# Patient Record
Sex: Female | Born: 1965
Health system: Southern US, Community
[De-identification: ages and names within clinical notes are randomized; demographics above are authoritative.]

## PROBLEM LIST (undated history)

## (undated) DIAGNOSIS — A630 Anogenital (venereal) warts: Secondary | ICD-10-CM

## (undated) DIAGNOSIS — R42 Dizziness and giddiness: Secondary | ICD-10-CM

## (undated) DIAGNOSIS — B019 Varicella without complication: Secondary | ICD-10-CM

## (undated) DIAGNOSIS — K635 Polyp of colon: Secondary | ICD-10-CM

## (undated) DIAGNOSIS — K219 Gastro-esophageal reflux disease without esophagitis: Secondary | ICD-10-CM

## (undated) DIAGNOSIS — N3281 Overactive bladder: Secondary | ICD-10-CM

## (undated) DIAGNOSIS — G039 Meningitis, unspecified: Secondary | ICD-10-CM

## (undated) HISTORY — DX: Varicella without complication: B01.9

## (undated) HISTORY — DX: Polyp of colon: K63.5

## (undated) HISTORY — DX: Anogenital (venereal) warts: A63.0

## (undated) HISTORY — DX: Gastro-esophageal reflux disease without esophagitis: K21.9

## (undated) HISTORY — DX: Dizziness and giddiness: R42

## (undated) HISTORY — PX: OTHER SURGICAL HISTORY: SHX169

## (undated) HISTORY — DX: Meningitis, unspecified: G03.9

## (undated) HISTORY — PX: BREAST SURGERY: SHX581

## (undated) HISTORY — DX: Overactive bladder: N32.81

---

## 2002-03-22 ENCOUNTER — Other Ambulatory Visit: Admission: RE | Admit: 2002-03-22 | Discharge: 2002-03-22 | Payer: Self-pay | Admitting: Obstetrics and Gynecology

## 2003-11-07 ENCOUNTER — Other Ambulatory Visit: Admission: RE | Admit: 2003-11-07 | Discharge: 2003-11-07 | Payer: Self-pay | Admitting: Obstetrics and Gynecology

## 2005-05-30 ENCOUNTER — Encounter: Admission: RE | Admit: 2005-05-30 | Discharge: 2005-05-30 | Payer: Self-pay | Admitting: Obstetrics and Gynecology

## 2010-12-29 DIAGNOSIS — G039 Meningitis, unspecified: Secondary | ICD-10-CM

## 2010-12-29 HISTORY — DX: Meningitis, unspecified: G03.9

## 2011-06-24 ENCOUNTER — Other Ambulatory Visit: Payer: Self-pay | Admitting: Family Medicine

## 2011-06-24 ENCOUNTER — Encounter: Payer: Self-pay | Admitting: Family Medicine

## 2011-06-24 ENCOUNTER — Inpatient Hospital Stay (INDEPENDENT_AMBULATORY_CARE_PROVIDER_SITE_OTHER)
Admission: RE | Admit: 2011-06-24 | Discharge: 2011-06-24 | Disposition: A | Discharge status: Home / Self Care | Payer: 59 | Source: Ambulatory Visit | Attending: Family Medicine | Admitting: Family Medicine

## 2011-06-24 ENCOUNTER — Ambulatory Visit
Admission: RE | Admit: 2011-06-24 | Discharge: 2011-06-24 | Disposition: A | Discharge status: Home / Self Care | Payer: 59 | Source: Ambulatory Visit | Attending: Family Medicine | Admitting: Family Medicine

## 2011-06-24 DIAGNOSIS — S90129A Contusion of unspecified lesser toe(s) without damage to nail, initial encounter: Secondary | ICD-10-CM

## 2011-06-24 DIAGNOSIS — S92919A Unspecified fracture of unspecified toe(s), initial encounter for closed fracture: Secondary | ICD-10-CM

## 2011-06-25 DIAGNOSIS — S92919A Unspecified fracture of unspecified toe(s), initial encounter for closed fracture: Secondary | ICD-10-CM

## 2011-06-27 ENCOUNTER — Encounter: Payer: Self-pay | Admitting: Emergency Medicine

## 2011-06-27 ENCOUNTER — Inpatient Hospital Stay (INDEPENDENT_AMBULATORY_CARE_PROVIDER_SITE_OTHER)
Admission: RE | Admit: 2011-06-27 | Discharge: 2011-06-27 | Disposition: A | Discharge status: Home / Self Care | Payer: 59 | Source: Ambulatory Visit | Attending: Emergency Medicine | Admitting: Emergency Medicine

## 2011-06-27 DIAGNOSIS — R5381 Other malaise: Secondary | ICD-10-CM

## 2011-06-27 DIAGNOSIS — R5383 Other fatigue: Secondary | ICD-10-CM | POA: Insufficient documentation

## 2011-06-27 DIAGNOSIS — B9789 Other viral agents as the cause of diseases classified elsewhere: Secondary | ICD-10-CM

## 2011-06-27 LAB — CONVERTED CEMR LAB
Bilirubin Urine: 1
Glucose, Urine, Semiquant: NEGATIVE
Ketones, urine, test strip: 2
Rapid Strep: NEGATIVE
Specific Gravity, Urine: 1.025
Urobilinogen, UA: 0.2

## 2011-06-29 ENCOUNTER — Telehealth (INDEPENDENT_AMBULATORY_CARE_PROVIDER_SITE_OTHER): Payer: Self-pay

## 2011-06-30 ENCOUNTER — Telehealth (INDEPENDENT_AMBULATORY_CARE_PROVIDER_SITE_OTHER): Payer: Self-pay | Admitting: Emergency Medicine

## 2011-12-01 NOTE — Progress Notes (Signed)
Summary: BODY ACHES/NAUESA/DIZZINESS (room 5)   Vital Signs:  Patient Profile:   45 Years Old Female CC:      body aches, dizzy, nausea, severe headache Height:     69 inches Weight:      221 pounds O2 Sat:      99 % O2 treatment:    Room Air Temp:     99.3 degrees F oral Pulse rate:   116 / minute Resp:     16 per minute BP sitting:   113 / 68  (left arm) Cuff size:   large  Vitals Entered By: Lavell Islam RN (June 27, 2011 6:52 PM)                  Current Allergies: No known allergies History of Present Illness History from: patient & husband Chief Complaint: body aches, dizzy, nausea, severe headache History of Present Illness: 45 Years Old Female complains of "being sick all over".  Feels dizzy, nauseated, sick to her stomach, body aches in her armpits and arms, feverish, HA.  She is normally very healthy and never goes to the doctor.  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  No rashes, tick bites, no new meds other than calcium and vitamin D.  She is feeling a little better now than earlier today in terms of all her symptoms but still feels ill.  Advil helps.  Under a lot of stress from this past weekend when she got into an argument with another mom.  Review of systems is grossly positive for everything: fever, nausea, fatigue, myalgias, headache, urinary frequency, mental stress.  Her husband is here and thinks her symptoms are simply from walking a lot in the heat in [place] these last few days.  REVIEW OF SYSTEMS Constitutional Symptoms      Denies fever, chills, night sweats, weight loss, weight gain, and fatigue.  Eyes       Denies change in vision, eye pain, eye discharge, glasses, contact lenses, and eye surgery. Ear/Nose/Throat/Mouth       Denies hearing loss/aids, change in hearing, ear pain, ear discharge, dizziness, frequent  runny nose, frequent nose bleeds, sinus problems, sore throat, hoarseness, and tooth pain or bleeding.  Respiratory       Denies dry cough, productive cough, wheezing, shortness of breath, asthma, bronchitis, and emphysema/COPD.  Cardiovascular       Denies murmurs, chest pain, and tires easily with exhertion.    Gastrointestinal       Complains of nausea/vomiting.      Denies stomach pain, diarrhea, constipation, blood in bowel movements, and indigestion. Genitourniary       Denies painful urination, kidney stones, and loss of urinary control.      Comments: pain in breasts Neurological       Complains of headaches and weakness.      Denies loss of or changes in sensation, numbness, tngling, tremors, paralysis, seizures, and fainting/blackouts.      Comments: dizzy Musculoskeletal       Denies muscle pain, joint pain, joint stiffness, decreased range of motion, redness, swelling, muscle weakness, and gout.  Skin       Denies bruising, unusual mles/lumps or sores, and hair/skin or nail changes.  Psych       Denies mood changes, temper/anger issues, anxiety/stress, speech problems,  depression, and sleep problems.  Past History:  Family History: Last updated: 06/27/2011  Family History of CAD Female 1st degree relative <60  Social History: Last updated: 06/24/2011 Never Smoked Alcohol use-no Drug use-no  Past Medical History: Reviewed history from 06/24/2011 and no changes required. Unremarkable  Past Surgical History: Reviewed history from 06/24/2011 and no changes required. Caesarean section breast augmentation  Family History:  Family History of CAD Female 1st degree relative <60  Social History: Reviewed history from 06/24/2011 and no changes required. Never Smoked Alcohol use-no Drug use-no Physical Exam General appearance: well developed, obese, mod distress and laying down and not looking at me and quite abrasive at first but talking more she sits up and seems  to be in no distress Pupils: PERRLA EOMI Ears: normal, no lesions or deformities Nasal: mucosa pink, nonedematous, no septal deviation, turbinates normal Oral/Pharynx: tongue normal, posterior pharynx without erythema or exudate Neck: neck supple,  trachea midline, no masses Chest/Lungs: no rales, wheezes, or rhonchi bilateral, breath sounds equal without effort Heart: regular rate and  rhythm, no murmur Abdomen: soft, non-tender without obvious organomegaly Extremities: FROM, normal gait Neurological: Cn 2-12 grossly intact, no obvious deficits Skin: no obvious rashes or lesions MSE: oriented to time, place, and person Assessment New Problems: VIRAL INFECTION (ICD-079.99) MALAISE AND FATIGUE (ICD-780.79) FAMILY HISTORY OF CAD FEMALE 1ST DEGREE RELATIVE <60 (ICD-V16.49)  She drank a whole bottle of water while here and seems to be feeling better throughout the visit  Plan New Medications/Changes: KEFLEX 500 MG CAPS (CEPHALEXIN) 1 by mouth three times a day for 1 week  #21 x 0, 06/27/2011, Hoyt Koch MD  New Orders: T-CBC w/Diff [[id number]] T-Comprehensive Metabolic Panel [[id number]] T-Ferritin [[id number]] T-TSH [[id number]] T-Iron [[id number]] UA Dipstick w/o Micro (automated)  [81003] Rapid Strep [47425] T-Culture, Urine [[id number]] Est. Patient Level IV [32951] Planning Comments:   Her range of symptoms are very confusing and vague with a completely normal physical exam.  Will test blood (CBC, CMP, TSH, ferratin, Iron), strep, and UA/UCx to see if we catch anything.  CBC will be likely most telling with the differential.  Rapid strep is negative.  UA is c/w mild dyhydration.  This is most likely viral syndrome with misc stress and mild dehydration and fatigue from overexerting herself, but in reality I'm not sure and have told the patient that.  Initially the nurse had a hard time getting any introductory info from them in terms of signs and symptoms.  She has  some vertigo symptoms as well but her nausea seems to be improving but she doesn't think it's vertigo and doesn't want to be treated with Phenergan or Meclizine.  Will start on Keflex per their ok in the meantime to cover any bacterial URI cause and encourage rest, hydration and not driving until not dizzy.  No red flags seen, but ER precautions given in case worsening.   Labs unable to be obtained so will return tomorrow.  She should also establish with a PCP since at 45 years old is the right thing to do.  The pt's husband asked me to prescribe her Vicodin for her pain and sleep meds, but the pt quickly told him she didn't want those things.  Nurse unable to obtain blood tonight, so will return tomorrow.  We will ask tomorrow how she's doing and if any worsening, may need an ER workup.   The patient and/or caregiver has been counseled thoroughly with regard to medications prescribed including dosage, schedule, interactions, rationale  for use, and possible side effects and they verbalize understanding.  Diagnoses and expected course of recovery discussed and will return if not improved as expected or if the condition worsens. Patient and/or caregiver verbalized understanding.  Prescriptions: KEFLEX 500 MG CAPS (CEPHALEXIN) 1 by mouth three times a day for 1 week  #21 x 0   Entered and Authorized by:   Hoyt Koch MD   Signed by:   Hoyt Koch MD on 06/27/2011   Method used:   Print then Give to Patient   RxID:   [id number]   Orders Added: 1)  T-CBC w/Diff [[id number]] 2)  T-Comprehensive Metabolic Panel [[id number]] 3)  T-Ferritin [[id number]] 4)  T-TSH [24401-02725] 5)  T-Iron [36644-03474] 6)  UA Dipstick w/o Micro (automated)  [81003] 7)  Rapid Strep [25956] 8)  T-Culture, Urine [[id number]] 9)  Est. Patient Level IV [51884]    Laboratory Results   Urine Tests  Date/Time Received: June 27, 2011 7:46 PM  Date/Time Reported: June 27, 2011 7:46 PM   Routine  Urinalysis   Color: straw Appearance: Hazy Glucose: negative   (Normal Range: Negative) Bilirubin: +1   (Normal Range: Negative) Ketone: +2   (Normal Range: Negative) Spec. Gravity: 1.025   (Normal Range: 1.003-1.035) Blood: trace-intact   (Normal Range: Negative) pH: 6.0   (Normal Range: 5.0-8.0) Protein: +2   (Normal Range: Negative) Urobilinogen: 0.2   (Normal Range: 0-1) Nitrite: negative   (Normal Range: Negative) Leukocyte Esterace: negative   (Normal Range: Negative)    Date/Time Received: June 27, 2011 7:45 PM  Date/Time Reported: June 27, 2011 7:45 PM   Other Tests  Rapid Strep: negative  Kit Test Internal QC: Negative   (Normal Range: Negative)  Appended Document: BODY ACHES/NAUESA/DIZZINESS (room 5) She has returned at the end of the day of the following day for her blood work.  She is still feeling ill, nauseated, and dizzy and is unable to get out of her car so the nurse went out to meet her & her husband.  I feel the correct course of action is to go directly to the ER.  They initially refuse "because the ER wouldn't know what to do" but are convinced since our labs take 24-48 hours to return.  I feel it's absolutely necessary to get STAT blood today and consider further workup to rule out more serious problems as well as get medical treatment.  We're sending them to the ER in [place]. DDx included vertigo that I suggested yesterday vs meningitis vs intracranial process vs gastroenteritis.  As I commented yesterday, I am unsure what her diagnosis is but I believe she needs further testing.

## 2011-12-01 NOTE — Progress Notes (Signed)
Summary: rt foot inj rm 2   Vital Signs:  Patient Profile:   45 Years Old Female CC:      RT foot injury x 9 days Height:     69 inches Weight:      221.25 pounds O2 Sat:      100 % O2 treatment:    Room Air Temp:     98.8 degrees F oral Pulse rate:   72 / minute Resp:     16 per minute BP sitting:   118 / 73  (left arm) Cuff size:   regular  Pt. in pain?   yes    Location:   RT foot    Intensity:   2  Vitals Entered By: Clemens Catholic LPN (June 24, 2011 6:56 PM)                   Updated Prior Medication List: PROTONIX 40 MG TBEC (PANTOPRAZOLE SODIUM)  ABT for acne Current Allergies: No known allergies History of Present Illness Chief Complaint: RT foot injury x 9 days History of Present Illness:  Subjective:  Patient complains of bumping her right 5 toe a total of 3 times over the past nine days.  She has persistent pain in the 5th toe with weight bearing.  REVIEW OF SYSTEMS Constitutional Symptoms      Denies fever, chills, night sweats, weight loss, weight gain, and fatigue.  Eyes       Denies change in vision, eye pain, eye discharge, glasses, contact lenses, and eye surgery. Ear/Nose/Throat/Mouth       Denies hearing loss/aids, change in hearing, ear pain, ear discharge, dizziness, frequent runny nose, frequent nose bleeds, sinus problems, sore throat, hoarseness, and tooth pain or bleeding.  Respiratory       Denies dry cough, productive cough, wheezing, shortness of breath, asthma, bronchitis, and emphysema/COPD.  Cardiovascular       Denies murmurs, chest pain, and tires easily with exhertion.    Gastrointestinal       Denies stomach pain, nausea/vomiting, diarrhea, constipation, blood in bowel movements, and indigestion. Genitourniary       Denies painful urination, kidney stones, and loss of urinary control. Neurological       Denies paralysis, seizures, and fainting/blackouts. Musculoskeletal       Denies muscle pain, joint pain, joint  stiffness, decreased range of motion, redness, swelling, muscle weakness, and gout.  Skin       Denies bruising, unusual mles/lumps or sores, and hair/skin or nail changes.  Psych       Denies mood changes, temper/anger issues, anxiety/stress, speech problems, depression, and sleep problems. Other Comments: pt states that she injured her RT foot x 9 days ago and she still has pain and swelling. she took aleve immediately after the injury, no other OTC meds.   Past History:  Past Medical History: Unremarkable  Past Surgical History: Caesarean section breast augmentation  Family History: none  Social History: Never Smoked Alcohol use-no Drug use-no Smoking Status:  never Drug Use:  no   Objective:  Appearance:  Patient is obese but otherwise appears healthy, stated age, and in no acute distress  Right foot:  Swelling and tenderness about the 5th toe and 5th MTP joint.  No tenderness over 5th metatarsal.  Distal neurovascular intact  X-ray right 5th toe:  IMPRESSION: Fracture of the proximal phalangeal bone of the little toe. Assessment New Problems: FRACTURE, TOE, RIGHT (ICD-826.0) CONTUSION OF TOE (ICD-924.3)   Plan New Orders:  T-DG Toe 5th*R* [73660] Post-op Shoe [L3260] Services provided After hours-Weekends-Holidays [99051] New Patient Level III [id number] Planning Comments:   Dispensed post-op shoe.   Recommend calcium/vitamin D.  Avoid further trauma.   The patient and/or caregiver has been counseled thoroughly with regard to medications prescribed including dosage, schedule, interactions, rationale for use, and possible side effects and they verbalize understanding.  Diagnoses and expected course of recovery discussed and will return if not improved as expected or if the condition worsens. Patient and/or caregiver verbalized understanding.   Orders Added: 1)  T-DG Toe 5th*R* [73660] 2)  Post-op Shoe [L3260] 3)  Services provided After hours-Weekends-Holidays  [99051] 4)  New Patient Level III [16109]

## 2011-12-01 NOTE — Telephone Encounter (Signed)
  Phone Note Outgoing Call   Call placed by: Lavell Islam RN,  June 30, 2011 12:59 PM Call placed to: Patient Action Taken: Phone Call Completed Summary of Call: Spoke with daughter who states Mom is doing better....went to work this AM. Asked her to take number and have patient call us. Initial call taken by: Lavell Islam RN,  June 30, 2011 1:01 PM

## 2011-12-01 NOTE — Telephone Encounter (Signed)
  Phone Note Outgoing Call   Call placed by: Linton Flemings RN,  June 29, 2011 3:46 PM Call placed to: Patient Summary of Call: called to check status of pt. no answer, message left. Initial call taken by: Linton Flemings RN,  June 29, 2011 3:47 PM

## 2013-12-29 LAB — HM MAMMOGRAPHY: HM Mammogram: NORMAL

## 2013-12-29 LAB — HM PAP SMEAR: HM Pap smear: NORMAL

## 2014-12-29 LAB — HM COLONOSCOPY

## 2015-04-10 ENCOUNTER — Encounter: Payer: Self-pay | Admitting: Family Medicine

## 2015-04-10 ENCOUNTER — Ambulatory Visit (INDEPENDENT_AMBULATORY_CARE_PROVIDER_SITE_OTHER): Payer: 59 | Admitting: Family Medicine

## 2015-04-10 VITALS — BP 132/86 | HR 76 | Temp 98.7°F | Resp 18 | Ht 68.0 in | Wt 228.0 lb

## 2015-04-10 DIAGNOSIS — E669 Obesity, unspecified: Secondary | ICD-10-CM

## 2015-04-10 DIAGNOSIS — M25561 Pain in right knee: Secondary | ICD-10-CM

## 2015-04-10 MED ORDER — PHENTERMINE HCL 37.5 MG PO TABS: 37.5000 mg | ORAL_TABLET | Freq: Every day | ORAL | Status: DC

## 2015-04-10 NOTE — Progress Notes (Signed)
Pre visit review using our clinic review tool, if applicable. No additional management support is needed unless otherwise documented below in the visit note. 

## 2015-04-10 NOTE — Patient Instructions (Signed)
Exercise to Lose Weight Exercise and a healthy diet may help you lose weight. Your doctor may suggest specific exercises. EXERCISE IDEAS AND TIPS  Choose low-cost things you enjoy doing, such as walking, bicycling, or exercising to workout videos.  Take stairs instead of the elevator.  Walk during your lunch break.  Park your car further away from work or school.  Go to a gym or an exercise class.  Start with 5 to 10 minutes of exercise each day. Build up to 30 minutes of exercise 4 to 6 days a week.  Wear shoes with good support and comfortable clothes.  Stretch before and after working out.  Work out until you breathe harder and your heart beats faster.  Drink extra water when you exercise.  Do not do so much that you hurt yourself, feel dizzy, or get very short of breath. Exercises that burn about 150 calories:  Running 1  miles in 15 minutes.  Playing volleyball for 45 to 60 minutes.  Washing and waxing a car for 45 to 60 minutes.  Playing touch football for 45 minutes.  Walking 1  miles in 35 minutes.  Pushing a stroller 1  miles in 30 minutes.  Playing basketball for 30 minutes.  Raking leaves for 30 minutes.  Bicycling 5 miles in 30 minutes.  Walking 2 miles in 30 minutes.  Dancing for 30 minutes.  Shoveling snow for 15 minutes.  Swimming laps for 20 minutes.  Walking up stairs for 15 minutes.  Bicycling 4 miles in 15 minutes.  Gardening for 30 to 45 minutes.  Jumping rope for 15 minutes.  Washing windows or floors for 45 to 60 minutes. Document Released: 01/17/2011 Document Revised: 03/08/2012 Document Reviewed: 01/17/2011 [employer] Patient Information 2015 [employer]. This information is not intended to replace advice given to you by your health care provider. Make sure you discuss any questions you have with your health care provider.  

## 2015-04-10 NOTE — Progress Notes (Signed)
Patient ID: Emily [name], female    DOB: Apr 08, 1966  Age: 49 y.o. MRN: [id number]    Subjective:  Subjective HPI Emily [name] presents to establish and c/o knee pain R knee for years but it has recently inc since gaining weight.  She has been unable to lose more then 40 lbs and is very frustrated.    Review of Systems  Constitutional: Negative for activity change, appetite change, fatigue and unexpected weight change.  Respiratory: Negative for cough and shortness of breath.   Cardiovascular: Negative for chest pain and palpitations.  Musculoskeletal: Positive for arthralgias and gait problem.  Psychiatric/Behavioral: Negative for behavioral problems and dysphoric mood. The patient is not nervous/anxious.     History Past Medical History  Diagnosis Date  . Meningitis spinal 2012  . Chicken pox   . Genital warts   . Colon polyp   . GERD (gastroesophageal reflux disease)     She has past surgical history that includes Cesarean section ('92, '01) and Breast surgery ('84).   Her family history is not on file.She reports that she has never smoked. She does not have any smokeless tobacco history on file. She reports that she drinks alcohol. She reports that she does not use illicit drugs.  No current outpatient prescriptions on file prior to visit.   No current facility-administered medications on file prior to visit.     Objective:  Objective Physical Exam  Constitutional: She is oriented to person, place, and time. She appears well-developed and well-nourished. No distress.  HENT:  Right Ear: External ear normal.  Left Ear: External ear normal.  Nose: Nose normal.  Mouth/Throat: Oropharynx is clear and moist.  Eyes: EOM are normal. Pupils are equal, round, and reactive to light.  Neck: Normal range of motion. Neck supple.  Cardiovascular: Normal rate, regular rhythm and normal heart sounds.   No murmur heard. Pulmonary/Chest: Effort normal and breath sounds normal. No  respiratory distress. She has no wheezes. She has no rales. She exhibits no tenderness.  Musculoskeletal:  Crepitus R knee with flexion and ext  Neurological: She is alert and oriented to person, place, and time.  Psychiatric: She has a normal mood and affect. Her behavior is normal. Judgment and thought content normal.   BP 132/86 mmHg  Pulse 76  Temp(Src) 98.7 F (37.1 C) (Oral)  Resp 18  Ht 5\' 8"  (1.727 m)  Wt 228 lb (103.42 kg)  BMI 34.68 kg/m2  SpO2 99%  LMP 12/11/2014 Wt Readings from Last 3 Encounters:  04/10/15 228 lb (103.42 kg)  06/27/11 221 lb (100.245 kg)  06/24/11 221 lb 4 oz (100.358 kg)     No results found for: WBC, HGB, HCT, PLT, GLUCOSE, CHOL, TRIG, HDL, LDLDIRECT, LDLCALC, ALT, AST, NA, K, CL, CREATININE, BUN, CO2, TSH, PSA, INR, GLUF, HGBA1C, MICROALBUR  No results found.   Assessment & Plan:  Plan I am having Ms. Alomar start on phentermine.  Meds ordered this encounter  Medications  . phentermine (ADIPEX-P) 37.5 MG tablet    Sig: Take 1 tablet (37.5 mg total) by mouth daily before breakfast.    Dispense:  30 tablet    Refill:  0    Problem List Items Addressed This Visit    None    Visit Diagnoses    Obesity    -  Primary    Relevant Medications    phentermine (ADIPEX-P) 37.5 MG tablet    Other Relevant Orders    EKG 12-Lead (Completed)  Right knee pain        Relevant Orders    Ambulatory referral to Orthopedic Surgery     rto 1 month for f/u weight  Follow-up: Return if symptoms worsen or fail to improve, for cpe.  Garnet Koyanagi, DO

## 2015-05-14 ENCOUNTER — Ambulatory Visit (INDEPENDENT_AMBULATORY_CARE_PROVIDER_SITE_OTHER): Payer: 59 | Admitting: Family Medicine

## 2015-05-14 ENCOUNTER — Encounter: Payer: Self-pay | Admitting: Family Medicine

## 2015-05-14 VITALS — BP 126/84 | HR 73 | Temp 98.1°F | Resp 18 | Ht 68.0 in | Wt 219.8 lb

## 2015-05-14 DIAGNOSIS — E669 Obesity, unspecified: Secondary | ICD-10-CM | POA: Diagnosis not present

## 2015-05-14 MED ORDER — PHENTERMINE HCL 37.5 MG PO TABS: 37.5000 mg | ORAL_TABLET | Freq: Every day | ORAL | Status: DC

## 2015-05-14 NOTE — Patient Instructions (Signed)
Exercise to Lose Weight Exercise and a healthy diet may help you lose weight. Your doctor may suggest specific exercises. EXERCISE IDEAS AND TIPS  Choose low-cost things you enjoy doing, such as walking, bicycling, or exercising to workout videos.  Take stairs instead of the elevator.  Walk during your lunch break.  Park your car further away from work or school.  Go to a gym or an exercise class.  Start with 5 to 10 minutes of exercise each day. Build up to 30 minutes of exercise 4 to 6 days a week.  Wear shoes with good support and comfortable clothes.  Stretch before and after working out.  Work out until you breathe harder and your heart beats faster.  Drink extra water when you exercise.  Do not do so much that you hurt yourself, feel dizzy, or get very short of breath. Exercises that burn about 150 calories:  Running 1  miles in 15 minutes.  Playing volleyball for 45 to 60 minutes.  Washing and waxing a car for 45 to 60 minutes.  Playing touch football for 45 minutes.  Walking 1  miles in 35 minutes.  Pushing a stroller 1  miles in 30 minutes.  Playing basketball for 30 minutes.  Raking leaves for 30 minutes.  Bicycling 5 miles in 30 minutes.  Walking 2 miles in 30 minutes.  Dancing for 30 minutes.  Shoveling snow for 15 minutes.  Swimming laps for 20 minutes.  Walking up stairs for 15 minutes.  Bicycling 4 miles in 15 minutes.  Gardening for 30 to 45 minutes.  Jumping rope for 15 minutes.  Washing windows or floors for 45 to 60 minutes. Document Released: 01/17/2011 Document Revised: 03/08/2012 Document Reviewed: 01/17/2011 [employer] Patient Information 2015 [employer]. This information is not intended to replace advice given to you by your health care provider. Make sure you discuss any questions you have with your health care provider.  

## 2015-05-14 NOTE — Progress Notes (Signed)
Pre visit review using our clinic review tool, if applicable. No additional management support is needed unless otherwise documented below in the visit note. 

## 2015-05-14 NOTE — Progress Notes (Signed)
   Subjective:    Patient ID: Emily [name], female    DOB: 10-16-1966, 49 y.o.   MRN: [id number]  HPI  Patient here for f/u adipex.  No complaints.  She is not exercising  Past Medical History  Diagnosis Date  . Meningitis spinal 2012  . Chicken pox   . Genital warts   . Colon polyp   . GERD (gastroesophageal reflux disease)     Review of Systems  No current outpatient prescriptions on file prior to visit.   No current facility-administered medications on file prior to visit.       Objective:    Physical Exam  Constitutional: She is oriented to person, place, and time. She appears well-developed and well-nourished.  HENT:  Head: Normocephalic and atraumatic.  Eyes: Conjunctivae and EOM are normal.  Neck: Normal range of motion. Neck supple. No JVD present. Carotid bruit is not present. No thyromegaly present.  Cardiovascular: Normal rate, regular rhythm and normal heart sounds.   No murmur heard. Pulmonary/Chest: Effort normal and breath sounds normal. No respiratory distress. She has no wheezes. She has no rales. She exhibits no tenderness.  Musculoskeletal: She exhibits no edema.  Neurological: She is alert and oriented to person, place, and time.  Psychiatric: She has a normal mood and affect.    BP 126/84 mmHg  Pulse 73  Temp(Src) 98.1 F (36.7 C) (Oral)  Resp 18  Ht 5\' 8"  (1.727 m)  Wt 219 lb 12.8 oz (99.701 kg)  BMI 33.43 kg/m2  SpO2 99% Wt Readings from Last 3 Encounters:  05/14/15 219 lb 12.8 oz (99.701 kg)  04/10/15 228 lb (103.42 kg)  06/27/11 221 lb (100.245 kg)     No results found for: WBC, HGB, HCT, PLT, GLUCOSE, CHOL, TRIG, HDL, LDLDIRECT, LDLCALC, ALT, AST, NA, K, CL, CREATININE, BUN, CO2, TSH, PSA, INR, GLUF, HGBA1C, MICROALBUR     Assessment & Plan:   Problem List Items Addressed This Visit    None    Visit Diagnoses    Obesity    -  Primary    Relevant Medications    phentermine (ADIPEX-P) 37.5 MG tablet       I am having Ms.  Cupps maintain her phentermine.  Meds ordered this encounter  Medications  . phentermine (ADIPEX-P) 37.5 MG tablet    Sig: Take 1 tablet (37.5 mg total) by mouth daily before breakfast.    Dispense:  30 tablet    Refill:  0     Garnet Koyanagi, DO

## 2015-05-31 ENCOUNTER — Ambulatory Visit: Payer: Self-pay | Admitting: Family Medicine

## 2015-06-12 ENCOUNTER — Encounter: Payer: 59 | Admitting: Family Medicine

## 2015-06-14 ENCOUNTER — Encounter: Payer: 59 | Admitting: Family Medicine

## 2015-06-14 ENCOUNTER — Telehealth: Payer: Self-pay | Admitting: Family Medicine

## 2015-06-14 DIAGNOSIS — E669 Obesity, unspecified: Secondary | ICD-10-CM

## 2015-06-14 MED ORDER — PHENTERMINE HCL 37.5 MG PO TABS: 37.5000 mg | ORAL_TABLET | Freq: Every day | ORAL | Status: DC

## 2015-06-14 NOTE — Telephone Encounter (Signed)
Caller name: Millianna Relation to pt: self Call back number: [phone] Pharmacy:  Reason for call:   Patient had to reschedule her cpe and is now out of phentermine. She is wanting to know if she can come by and do labs(for this) and get another rx. Patient did schedule a follow up for next Friday for med refill and cpe in late July. Is requesting a callback as soon as possible so she can have time to pick up rx before 2pm. She is going out of town.

## 2015-06-14 NOTE — Telephone Encounter (Signed)
Give her 1/2 rx but we normally do not refill that med without appointment

## 2015-06-14 NOTE — Telephone Encounter (Signed)
Patient is on her way out of town and will call with a pharmacy tomorrow.     KP

## 2015-06-14 NOTE — Telephone Encounter (Signed)
Please advise      KP 

## 2015-06-22 ENCOUNTER — Telehealth: Payer: Self-pay | Admitting: Family Medicine

## 2015-06-22 ENCOUNTER — Ambulatory Visit (INDEPENDENT_AMBULATORY_CARE_PROVIDER_SITE_OTHER): Payer: 59 | Admitting: Family Medicine

## 2015-06-22 ENCOUNTER — Encounter: Payer: Self-pay | Admitting: Family Medicine

## 2015-06-22 VITALS — BP 116/70 | HR 67 | Temp 98.3°F | Wt 214.0 lb

## 2015-06-22 DIAGNOSIS — E669 Obesity, unspecified: Secondary | ICD-10-CM | POA: Diagnosis not present

## 2015-06-22 MED ORDER — NALTREXONE-BUPROPION HCL ER 8-90 MG PO TB12: ORAL_TABLET | ORAL | Status: DC

## 2015-06-22 MED ORDER — PHENTERMINE HCL 37.5 MG PO TABS: 37.5000 mg | ORAL_TABLET | Freq: Every day | ORAL | Status: DC

## 2015-06-22 NOTE — Progress Notes (Signed)
Pre visit review using our clinic review tool, if applicable. No additional management support is needed unless otherwise documented below in the visit note. 

## 2015-06-22 NOTE — Telephone Encounter (Signed)
Relation to pt: self  Call back number: [phone]   Reason for call:  [facility] card has expired, pt requesting another

## 2015-06-22 NOTE — Patient Instructions (Signed)

## 2015-06-22 NOTE — Progress Notes (Signed)
Patient ID: Emily [name], female    DOB: [birth year]  Age: 49 y.o. MRN: [id number]    Subjective:  Subjective HPI Emily [name] presents for f/u weight loss.  Pt is not exercising and is trying to watch what she eats.  Review of Systems  Constitutional: Negative for activity change, appetite change, fatigue and unexpected weight change.  Respiratory: Negative for cough and shortness of breath.   Cardiovascular: Negative for chest pain and palpitations.  Psychiatric/Behavioral: Negative for behavioral problems and dysphoric mood. The patient is not nervous/anxious.     History Past Medical History  Diagnosis Date  . Meningitis spinal 2012  . Chicken pox   . Genital warts   . Colon polyp   . GERD (gastroesophageal reflux disease)     She has past surgical history that includes Cesarean section ('92, '01) and Breast surgery ('84).   Her family history is not on file.She reports that she has never smoked. She does not have any smokeless tobacco history on file. She reports that she drinks alcohol. She reports that she does not use illicit drugs.  No current outpatient prescriptions on file prior to visit.   No current facility-administered medications on file prior to visit.     Objective:  Objective Physical Exam  Constitutional: She is oriented to person, place, and time. She appears well-developed and well-nourished.  HENT:  Head: Normocephalic and atraumatic.  Eyes: Conjunctivae and EOM are normal.  Neck: Normal range of motion. Neck supple. No JVD present. Carotid bruit is not present. No thyromegaly present.  Cardiovascular: Normal rate, regular rhythm and normal heart sounds.   No murmur heard. Pulmonary/Chest: Effort normal and breath sounds normal. No respiratory distress. She has no wheezes. She has no rales. She exhibits no tenderness.  Musculoskeletal: She exhibits no edema.  Neurological: She is alert and oriented to person, place, and time.  Psychiatric: She  has a normal mood and affect. Her behavior is normal.   BP 116/70 mmHg  Pulse 67  Temp(Src) 98.3 F (36.8 C) (Oral)  Wt 214 lb (97.07 kg)  SpO2 98% Wt Readings from Last 3 Encounters:  06/22/15 214 lb (97.07 kg)  05/14/15 219 lb 12.8 oz (99.701 kg)  04/10/15 228 lb (103.42 kg)     No results found for: WBC, HGB, HCT, PLT, GLUCOSE, CHOL, TRIG, HDL, LDLDIRECT, LDLCALC, ALT, AST, NA, K, CL, CREATININE, BUN, CO2, TSH, PSA, INR, GLUF, HGBA1C, MICROALBUR  No results found.   Assessment & Plan:  Plan I am having [name] start on Naltrexone-Bupropion HCl ER. I am also having her maintain her phentermine.  Meds ordered this encounter  Medications  . phentermine (ADIPEX-P) 37.5 MG tablet    Sig: Take 1 tablet (37.5 mg total) by mouth daily before breakfast.    Dispense:  30 tablet    Refill:  0  . Naltrexone-Bupropion HCl ER (CONTRAVE) 8-90 MG TB12    Sig: 2 po bid    Dispense:  120 tablet    Refill:  3    Problem List Items Addressed This Visit    None    Visit Diagnoses    Obesity    -  Primary    Relevant Medications    phentermine (ADIPEX-P) 37.5 MG tablet    Naltrexone-Bupropion HCl ER (CONTRAVE) 8-90 MG TB12     1 more month adipex and then start contrave Encouraged pt to start exercising  Follow-up: Return in about 4 weeks (around 07/20/2015), or if symptoms worsen or fail  to improve, for weight check.  Emily Koyanagi, DO

## 2015-06-22 NOTE — Telephone Encounter (Signed)
Discount card left at check in.      KP

## 2015-07-06 ENCOUNTER — Telehealth: Payer: Self-pay | Admitting: Family Medicine

## 2015-07-06 NOTE — Telephone Encounter (Signed)
Pre visit letter mailed 07/06/15

## 2015-07-26 ENCOUNTER — Encounter: Payer: Self-pay | Admitting: *Deleted

## 2015-07-26 ENCOUNTER — Telehealth: Payer: Self-pay | Admitting: *Deleted

## 2015-07-26 NOTE — Telephone Encounter (Signed)
Pre-Visit Call completed with patient and chart updated.   Pre-Visit Info documented in Specialty Comments under [employer].    

## 2015-07-27 ENCOUNTER — Encounter: Payer: Self-pay | Admitting: Family Medicine

## 2015-07-27 ENCOUNTER — Ambulatory Visit (INDEPENDENT_AMBULATORY_CARE_PROVIDER_SITE_OTHER): Payer: 59 | Admitting: Family Medicine

## 2015-07-27 VITALS — BP 118/82 | HR 95 | Temp 98.1°F | Ht 68.0 in | Wt 206.0 lb

## 2015-07-27 DIAGNOSIS — E669 Obesity, unspecified: Secondary | ICD-10-CM

## 2015-07-27 DIAGNOSIS — Z0000 Encounter for general adult medical examination without abnormal findings: Secondary | ICD-10-CM | POA: Diagnosis not present

## 2015-07-27 DIAGNOSIS — Z23 Encounter for immunization: Secondary | ICD-10-CM | POA: Diagnosis not present

## 2015-07-27 MED ORDER — NALTREXONE-BUPROPION HCL ER 8-90 MG PO TB12: ORAL_TABLET | ORAL | Status: DC

## 2015-07-27 NOTE — Patient Instructions (Signed)
Preventive Care for Adults A healthy lifestyle and preventive care can promote health and wellness. Preventive health guidelines for women include the following key practices.  A routine yearly physical is a good way to check with your health care provider about your health and preventive screening. It is a chance to share any concerns and updates on your health and to receive a thorough exam.  Visit your dentist for a routine exam and preventive care every 6 months. Brush your teeth twice a day and floss once a day. Good oral hygiene prevents tooth decay and gum disease.  The frequency of eye exams is based on your age, health, family medical history, use of contact lenses, and other factors. Follow your health care provider's recommendations for frequency of eye exams.  Eat a healthy diet. Foods like vegetables, fruits, whole grains, low-fat dairy products, and lean protein foods contain the nutrients you need without too many calories. Decrease your intake of foods high in solid fats, added sugars, and salt. Eat the right amount of calories for you.Get information about a proper diet from your health care provider, if necessary.  Regular physical exercise is one of the most important things you can do for your health. Most adults should get at least 150 minutes of moderate-intensity exercise (any activity that increases your heart rate and causes you to sweat) each week. In addition, most adults need muscle-strengthening exercises on 2 or more days a week.  Maintain a healthy weight. The body mass index (BMI) is a screening tool to identify possible weight problems. It provides an estimate of body fat based on height and weight. Your health care provider can find your BMI and can help you achieve or maintain a healthy weight.For adults 20 years and older:  A BMI below 18.5 is considered underweight.  A BMI of 18.5 to 24.9 is normal.  A BMI of 25 to 29.9 is considered overweight.  A BMI of  30 and above is considered obese.  Maintain normal blood lipids and cholesterol levels by exercising and minimizing your intake of saturated fat. Eat a balanced diet with plenty of fruit and vegetables. Blood tests for lipids and cholesterol should begin at age 76 and be repeated every 5 years. If your lipid or cholesterol levels are high, you are over 50, or you are at high risk for heart disease, you may need your cholesterol levels checked more frequently.Ongoing high lipid and cholesterol levels should be treated with medicines if diet and exercise are not working.  If you smoke, find out from your health care provider how to quit. If you do not use tobacco, do not start.  Lung cancer screening is recommended for adults aged 22-80 years who are at high risk for developing lung cancer because of a history of smoking. A yearly low-dose CT scan of the lungs is recommended for people who have at least a 30-pack-year history of smoking and are a current smoker or have quit within the past 15 years. A pack year of smoking is smoking an average of 1 pack of cigarettes a day for 1 year (for example: 1 pack a day for 30 years or 2 packs a day for 15 years). Yearly screening should continue until the smoker has stopped smoking for at least 15 years. Yearly screening should be stopped for people who develop a health problem that would prevent them from having lung cancer treatment.  If you are pregnant, do not drink alcohol. If you are breastfeeding,  be very cautious about drinking alcohol. If you are not pregnant and choose to drink alcohol, do not have more than 1 drink per day. One drink is considered to be 12 ounces (355 mL) of beer, 5 ounces (148 mL) of wine, or 1.5 ounces (44 mL) of liquor.  Avoid use of street drugs. Do not share needles with anyone. Ask for help if you need support or instructions about stopping the use of drugs.  High blood pressure causes heart disease and increases the risk of  stroke. Your blood pressure should be checked at least every 1 to 2 years. Ongoing high blood pressure should be treated with medicines if weight loss and exercise do not work.  If you are 75-52 years old, ask your health care provider if you should take aspirin to prevent strokes.  Diabetes screening involves taking a blood sample to check your fasting blood sugar level. This should be done once every 3 years, after age 15, if you are within normal weight and without risk factors for diabetes. Testing should be considered at a younger age or be carried out more frequently if you are overweight and have at least 1 risk factor for diabetes.  Breast cancer screening is essential preventive care for women. You should practice "breast self-awareness." This means understanding the normal appearance and feel of your breasts and may include breast self-examination. Any changes detected, no matter how small, should be reported to a health care provider. Women in their 58s and 30s should have a clinical breast exam (CBE) by a health care provider as part of a regular health exam every 1 to 3 years. After age 16, women should have a CBE every year. Starting at age 53, women should consider having a mammogram (breast X-ray test) every year. Women who have a family history of breast cancer should talk to their health care provider about genetic screening. Women at a high risk of breast cancer should talk to their health care providers about having an MRI and a mammogram every year.  Breast cancer gene (BRCA)-related cancer risk assessment is recommended for women who have family members with BRCA-related cancers. BRCA-related cancers include breast, ovarian, tubal, and peritoneal cancers. Having family members with these cancers may be associated with an increased risk for harmful changes (mutations) in the breast cancer genes BRCA1 and BRCA2. Results of the assessment will determine the need for genetic counseling and  BRCA1 and BRCA2 testing.  Routine pelvic exams to screen for cancer are no longer recommended for nonpregnant women who are considered low risk for cancer of the pelvic organs (ovaries, uterus, and vagina) and who do not have symptoms. Ask your health care provider if a screening pelvic exam is right for you.  If you have had past treatment for cervical cancer or a condition that could lead to cancer, you need Pap tests and screening for cancer for at least 20 years after your treatment. If Pap tests have been discontinued, your risk factors (such as having a new sexual partner) need to be reassessed to determine if screening should be resumed. Some women have medical problems that increase the chance of getting cervical cancer. In these cases, your health care provider may recommend more frequent screening and Pap tests.  The HPV test is an additional test that may be used for cervical cancer screening. The HPV test looks for the virus that can cause the cell changes on the cervix. The cells collected during the Pap test can be  tested for HPV. The HPV test could be used to screen women aged 30 years and older, and should be used in women of any age who have unclear Pap test results. After the age of 30, women should have HPV testing at the same frequency as a Pap test.  Colorectal cancer can be detected and often prevented. Most routine colorectal cancer screening begins at the age of 50 years and continues through age 75 years. However, your health care provider may recommend screening at an earlier age if you have risk factors for colon cancer. On a yearly basis, your health care provider may provide home test kits to check for hidden blood in the stool. Use of a small camera at the end of a tube, to directly examine the colon (sigmoidoscopy or colonoscopy), can detect the earliest forms of colorectal cancer. Talk to your health care provider about this at age 50, when routine screening begins. Direct  exam of the colon should be repeated every 5-10 years through age 75 years, unless early forms of pre-cancerous polyps or small growths are found.  People who are at an increased risk for hepatitis B should be screened for this virus. You are considered at high risk for hepatitis B if:  You were born in a country where hepatitis B occurs often. Talk with your health care provider about which countries are considered high risk.  Your parents were born in a high-risk country and you have not received a shot to protect against hepatitis B (hepatitis B vaccine).  You have HIV or AIDS.  You use needles to inject street drugs.  You live with, or have sex with, someone who has hepatitis B.  You get hemodialysis treatment.  You take certain medicines for conditions like cancer, organ transplantation, and autoimmune conditions.  Hepatitis C blood testing is recommended for all people born from 1945 through 1965 and any individual with known risks for hepatitis C.  Practice safe sex. Use condoms and avoid high-risk sexual practices to reduce the spread of sexually transmitted infections (STIs). STIs include gonorrhea, chlamydia, syphilis, trichomonas, herpes, HPV, and human immunodeficiency virus (HIV). Herpes, HIV, and HPV are viral illnesses that have no cure. They can result in disability, cancer, and death.  You should be screened for sexually transmitted illnesses (STIs) including gonorrhea and chlamydia if:  You are sexually active and are younger than 24 years.  You are older than 24 years and your health care provider tells you that you are at risk for this type of infection.  Your sexual activity has changed since you were last screened and you are at an increased risk for chlamydia or gonorrhea. Ask your health care provider if you are at risk.  If you are at risk of being infected with HIV, it is recommended that you take a prescription medicine daily to prevent HIV infection. This is  called preexposure prophylaxis (PrEP). You are considered at risk if:  You are a heterosexual woman, are sexually active, and are at increased risk for HIV infection.  You take drugs by injection.  You are sexually active with a partner who has HIV.  Talk with your health care provider about whether you are at high risk of being infected with HIV. If you choose to begin PrEP, you should first be tested for HIV. You should then be tested every 3 months for as long as you are taking PrEP.  Osteoporosis is a disease in which the bones lose minerals and strength   with aging. This can result in serious bone fractures or breaks. The risk of osteoporosis can be identified using a bone density scan. Women ages 65 years and over and women at risk for fractures or osteoporosis should discuss screening with their health care providers. Ask your health care provider whether you should take a calcium supplement or vitamin D to reduce the rate of osteoporosis.  Menopause can be associated with physical symptoms and risks. Hormone replacement therapy is available to decrease symptoms and risks. You should talk to your health care provider about whether hormone replacement therapy is right for you.  Use sunscreen. Apply sunscreen liberally and repeatedly throughout the day. You should seek shade when your shadow is shorter than you. Protect yourself by wearing long sleeves, pants, a wide-brimmed hat, and sunglasses year round, whenever you are outdoors.  Once a month, do a whole body skin exam, using a mirror to look at the skin on your back. Tell your health care provider of new moles, moles that have irregular borders, moles that are larger than a pencil eraser, or moles that have changed in shape or color.  Stay current with required vaccines (immunizations).  Influenza vaccine. All adults should be immunized every year.  Tetanus, diphtheria, and acellular pertussis (Td, Tdap) vaccine. Pregnant women should  receive 1 dose of Tdap vaccine during each pregnancy. The dose should be obtained regardless of the length of time since the last dose. Immunization is preferred during the 27th-36th week of gestation. An adult who has not previously received Tdap or who does not know her vaccine status should receive 1 dose of Tdap. This initial dose should be followed by tetanus and diphtheria toxoids (Td) booster doses every 10 years. Adults with an unknown or incomplete history of completing a 3-dose immunization series with Td-containing vaccines should begin or complete a primary immunization series including a Tdap dose. Adults should receive a Td booster every 10 years.  Varicella vaccine. An adult without evidence of immunity to varicella should receive 2 doses or a second dose if she has previously received 1 dose. Pregnant females who do not have evidence of immunity should receive the first dose after pregnancy. This first dose should be obtained before leaving the health care facility. The second dose should be obtained 4-8 weeks after the first dose.  Human papillomavirus (HPV) vaccine. Females aged 13-26 years who have not received the vaccine previously should obtain the 3-dose series. The vaccine is not recommended for use in pregnant females. However, pregnancy testing is not needed before receiving a dose. If a female is found to be pregnant after receiving a dose, no treatment is needed. In that case, the remaining doses should be delayed until after the pregnancy. Immunization is recommended for any person with an immunocompromised condition through the age of 26 years if she did not get any or all doses earlier. During the 3-dose series, the second dose should be obtained 4-8 weeks after the first dose. The third dose should be obtained 24 weeks after the first dose and 16 weeks after the second dose.  Zoster vaccine. One dose is recommended for adults aged 60 years or older unless certain conditions are  present.  Measles, mumps, and rubella (MMR) vaccine. Adults born before 1957 generally are considered immune to measles and mumps. Adults born in 1957 or later should have 1 or more doses of MMR vaccine unless there is a contraindication to the vaccine or there is laboratory evidence of immunity to   each of the three diseases. A routine second dose of MMR vaccine should be obtained at least 28 days after the first dose for students attending postsecondary schools, health care workers, or international travelers. People who received inactivated measles vaccine or an unknown type of measles vaccine during 1963-1967 should receive 2 doses of MMR vaccine. People who received inactivated mumps vaccine or an unknown type of mumps vaccine before 1979 and are at high risk for mumps infection should consider immunization with 2 doses of MMR vaccine. For females of childbearing age, rubella immunity should be determined. If there is no evidence of immunity, females who are not pregnant should be vaccinated. If there is no evidence of immunity, females who are pregnant should delay immunization until after pregnancy. Unvaccinated health care workers born before 1957 who lack laboratory evidence of measles, mumps, or rubella immunity or laboratory confirmation of disease should consider measles and mumps immunization with 2 doses of MMR vaccine or rubella immunization with 1 dose of MMR vaccine.  Pneumococcal 13-valent conjugate (PCV13) vaccine. When indicated, a person who is uncertain of her immunization history and has no record of immunization should receive the PCV13 vaccine. An adult aged 19 years or older who has certain medical conditions and has not been previously immunized should receive 1 dose of PCV13 vaccine. This PCV13 should be followed with a dose of pneumococcal polysaccharide (PPSV23) vaccine. The PPSV23 vaccine dose should be obtained at least 8 weeks after the dose of PCV13 vaccine. An adult aged 19  years or older who has certain medical conditions and previously received 1 or more doses of PPSV23 vaccine should receive 1 dose of PCV13. The PCV13 vaccine dose should be obtained 1 or more years after the last PPSV23 vaccine dose.  Pneumococcal polysaccharide (PPSV23) vaccine. When PCV13 is also indicated, PCV13 should be obtained first. All adults aged 65 years and older should be immunized. An adult younger than age 65 years who has certain medical conditions should be immunized. Any person who resides in a nursing home or long-term care facility should be immunized. An adult smoker should be immunized. People with an immunocompromised condition and certain other conditions should receive both PCV13 and PPSV23 vaccines. People with human immunodeficiency virus (HIV) infection should be immunized as soon as possible after diagnosis. Immunization during chemotherapy or radiation therapy should be avoided. Routine use of PPSV23 vaccine is not recommended for American Indians, [place] Natives, or people younger than 65 years unless there are medical conditions that require PPSV23 vaccine. When indicated, people who have unknown immunization and have no record of immunization should receive PPSV23 vaccine. One-time revaccination 5 years after the first dose of PPSV23 is recommended for people aged 19-64 years who have chronic kidney failure, nephrotic syndrome, asplenia, or immunocompromised conditions. People who received 1-2 doses of PPSV23 before age 65 years should receive another dose of PPSV23 vaccine at age 65 years or later if at least 5 years have passed since the previous dose. Doses of PPSV23 are not needed for people immunized with PPSV23 at or after age 65 years.  Meningococcal vaccine. Adults with asplenia or persistent complement component deficiencies should receive 2 doses of quadrivalent meningococcal conjugate (MenACWY-D) vaccine. The doses should be obtained at least 2 months apart.  Microbiologists working with certain meningococcal bacteria, military recruits, people at risk during an outbreak, and people who travel to or live in countries with a high rate of meningitis should be immunized. A first-year college student up through age   21 years who is living in a residence hall should receive a dose if she did not receive a dose on or after her 16th birthday. Adults who have certain high-risk conditions should receive one or more doses of vaccine.  Hepatitis A vaccine. Adults who wish to be protected from this disease, have certain high-risk conditions, work with hepatitis A-infected animals, work in hepatitis A research labs, or travel to or work in countries with a high rate of hepatitis A should be immunized. Adults who were previously unvaccinated and who anticipate close contact with an international adoptee during the first 60 days after arrival in the [place] from a country with a high rate of hepatitis A should be immunized.  Hepatitis B vaccine. Adults who wish to be protected from this disease, have certain high-risk conditions, may be exposed to blood or other infectious body fluids, are household contacts or sex partners of hepatitis B positive people, are clients or workers in certain care facilities, or travel to or work in countries with a high rate of hepatitis B should be immunized.  Haemophilus influenzae type b (Hib) vaccine. A previously unvaccinated person with asplenia or sickle cell disease or having a scheduled splenectomy should receive 1 dose of Hib vaccine. Regardless of previous immunization, a recipient of a hematopoietic stem cell transplant should receive a 3-dose series 6-12 months after her successful transplant. Hib vaccine is not recommended for adults with HIV infection. Preventive Services / Frequency Ages 64 to 68 years  Blood pressure check.** / Every 1 to 2 years.  Lipid and cholesterol check.** / Every 5 years beginning at age  22.  Clinical breast exam.** / Every 3 years for women in their 88s and 53s.  BRCA-related cancer risk assessment.** / For women who have family members with a BRCA-related cancer (breast, ovarian, tubal, or peritoneal cancers).  Pap test.** / Every 2 years from ages 90 through 51. Every 3 years starting at age 21 through age 56 or 3 with a history of 3 consecutive normal Pap tests.  HPV screening.** / Every 3 years from ages 24 through ages 1 to 46 with a history of 3 consecutive normal Pap tests.  Hepatitis C blood test.** / For any individual with known risks for hepatitis C.  Skin self-exam. / Monthly.  Influenza vaccine. / Every year.  Tetanus, diphtheria, and acellular pertussis (Tdap, Td) vaccine.** / Consult your health care provider. Pregnant women should receive 1 dose of Tdap vaccine during each pregnancy. 1 dose of Td every 10 years.  Varicella vaccine.** / Consult your health care provider. Pregnant females who do not have evidence of immunity should receive the first dose after pregnancy.  HPV vaccine. / 3 doses over 6 months, if 72 and younger. The vaccine is not recommended for use in pregnant females. However, pregnancy testing is not needed before receiving a dose.  Measles, mumps, rubella (MMR) vaccine.** / You need at least 1 dose of MMR if you were born in 1957 or later. You may also need a 2nd dose. For females of childbearing age, rubella immunity should be determined. If there is no evidence of immunity, females who are not pregnant should be vaccinated. If there is no evidence of immunity, females who are pregnant should delay immunization until after pregnancy.  Pneumococcal 13-valent conjugate (PCV13) vaccine.** / Consult your health care provider.  Pneumococcal polysaccharide (PPSV23) vaccine.** / 1 to 2 doses if you smoke cigarettes or if you have certain conditions.  Meningococcal vaccine.** /  1 dose if you are age 19 to 21 years and a first-year college  student living in a residence hall, or have one of several medical conditions, you need to get vaccinated against meningococcal disease. You may also need additional booster doses.  Hepatitis A vaccine.** / Consult your health care provider.  Hepatitis B vaccine.** / Consult your health care provider.  Haemophilus influenzae type b (Hib) vaccine.** / Consult your health care provider. Ages 40 to 64 years  Blood pressure check.** / Every 1 to 2 years.  Lipid and cholesterol check.** / Every 5 years beginning at age 20 years.  Lung cancer screening. / Every year if you are aged 55-80 years and have a 30-pack-year history of smoking and currently smoke or have quit within the past 15 years. Yearly screening is stopped once you have quit smoking for at least 15 years or develop a health problem that would prevent you from having lung cancer treatment.  Clinical breast exam.** / Every year after age 40 years.  BRCA-related cancer risk assessment.** / For women who have family members with a BRCA-related cancer (breast, ovarian, tubal, or peritoneal cancers).  Mammogram.** / Every year beginning at age 40 years and continuing for as long as you are in good health. Consult with your health care provider.  Pap test.** / Every 3 years starting at age 30 years through age 65 or 70 years with a history of 3 consecutive normal Pap tests.  HPV screening.** / Every 3 years from ages 30 years through ages 65 to 70 years with a history of 3 consecutive normal Pap tests.  Fecal occult blood test (FOBT) of stool. / Every year beginning at age 50 years and continuing until age 75 years. You may not need to do this test if you get a colonoscopy every 10 years.  Flexible sigmoidoscopy or colonoscopy.** / Every 5 years for a flexible sigmoidoscopy or every 10 years for a colonoscopy beginning at age 50 years and continuing until age 75 years.  Hepatitis C blood test.** / For all people born from 1945 through  1965 and any individual with known risks for hepatitis C.  Skin self-exam. / Monthly.  Influenza vaccine. / Every year.  Tetanus, diphtheria, and acellular pertussis (Tdap/Td) vaccine.** / Consult your health care provider. Pregnant women should receive 1 dose of Tdap vaccine during each pregnancy. 1 dose of Td every 10 years.  Varicella vaccine.** / Consult your health care provider. Pregnant females who do not have evidence of immunity should receive the first dose after pregnancy.  Zoster vaccine.** / 1 dose for adults aged 60 years or older.  Measles, mumps, rubella (MMR) vaccine.** / You need at least 1 dose of MMR if you were born in 1957 or later. You may also need a 2nd dose. For females of childbearing age, rubella immunity should be determined. If there is no evidence of immunity, females who are not pregnant should be vaccinated. If there is no evidence of immunity, females who are pregnant should delay immunization until after pregnancy.  Pneumococcal 13-valent conjugate (PCV13) vaccine.** / Consult your health care provider.  Pneumococcal polysaccharide (PPSV23) vaccine.** / 1 to 2 doses if you smoke cigarettes or if you have certain conditions.  Meningococcal vaccine.** / Consult your health care provider.  Hepatitis A vaccine.** / Consult your health care provider.  Hepatitis B vaccine.** / Consult your health care provider.  Haemophilus influenzae type b (Hib) vaccine.** / Consult your health care provider. Ages 65   years and over  Blood pressure check.** / Every 1 to 2 years.  Lipid and cholesterol check.** / Every 5 years beginning at age 22 years.  Lung cancer screening. / Every year if you are aged 73-80 years and have a 30-pack-year history of smoking and currently smoke or have quit within the past 15 years. Yearly screening is stopped once you have quit smoking for at least 15 years or develop a health problem that would prevent you from having lung cancer  treatment.  Clinical breast exam.** / Every year after age 4 years.  BRCA-related cancer risk assessment.** / For women who have family members with a BRCA-related cancer (breast, ovarian, tubal, or peritoneal cancers).  Mammogram.** / Every year beginning at age 40 years and continuing for as long as you are in good health. Consult with your health care provider.  Pap test.** / Every 3 years starting at age 9 years through age 34 or 91 years with 3 consecutive normal Pap tests. Testing can be stopped between 65 and 70 years with 3 consecutive normal Pap tests and no abnormal Pap or HPV tests in the past 10 years.  HPV screening.** / Every 3 years from ages 57 years through ages 64 or 45 years with a history of 3 consecutive normal Pap tests. Testing can be stopped between 65 and 70 years with 3 consecutive normal Pap tests and no abnormal Pap or HPV tests in the past 10 years.  Fecal occult blood test (FOBT) of stool. / Every year beginning at age 15 years and continuing until age 17 years. You may not need to do this test if you get a colonoscopy every 10 years.  Flexible sigmoidoscopy or colonoscopy.** / Every 5 years for a flexible sigmoidoscopy or every 10 years for a colonoscopy beginning at age 86 years and continuing until age 71 years.  Hepatitis C blood test.** / For all people born from [birth year] through 1967 and any individual with known risks for hepatitis C.  Osteoporosis screening.** / A one-time screening for women ages 83 years and over and women at risk for fractures or osteoporosis.  Skin self-exam. / Monthly.  Influenza vaccine. / Every year.  Tetanus, diphtheria, and acellular pertussis (Tdap/Td) vaccine.** / 1 dose of Td every 10 years.  Varicella vaccine.** / Consult your health care provider.  Zoster vaccine.** / 1 dose for adults aged 61 years or older.  Pneumococcal 13-valent conjugate (PCV13) vaccine.** / Consult your health care provider.  Pneumococcal  polysaccharide (PPSV23) vaccine.** / 1 dose for all adults aged 28 years and older.  Meningococcal vaccine.** / Consult your health care provider.  Hepatitis A vaccine.** / Consult your health care provider.  Hepatitis B vaccine.** / Consult your health care provider.  Haemophilus influenzae type b (Hib) vaccine.** / Consult your health care provider. ** Family history and personal history of risk and conditions may change your health care provider's recommendations. Document Released: 02/10/2002 Document Revised: 05/01/2014 Document Reviewed: 05/12/2011 [facility] Patient Information 2015 [place], [place]. This information is not intended to replace advice given to you by your health care provider. Make sure you discuss any questions you have with your health care provider.

## 2015-07-27 NOTE — Progress Notes (Signed)
Pre visit review using our clinic review tool, if applicable. No additional management support is needed unless otherwise documented below in the visit note. 

## 2015-07-27 NOTE — Progress Notes (Signed)
Patient ID: Emily [name], female    DOB: [birth year]  Age: 49 y.o. MRN: [id number]    Subjective:  Subjective HPI Emily [name] presents for cpe and med refill.    Review of Systems  Constitutional: Negative for diaphoresis, appetite change, fatigue and unexpected weight change.  Eyes: Negative for pain, redness and visual disturbance.  Respiratory: Negative for cough, chest tightness, shortness of breath and wheezing.   Cardiovascular: Negative for chest pain, palpitations and leg swelling.  Endocrine: Negative for cold intolerance, heat intolerance, polydipsia, polyphagia and polyuria.  Genitourinary: Negative for dysuria, frequency and difficulty urinating.  Neurological: Negative for dizziness, light-headedness, numbness and headaches.    History Past Medical History  Diagnosis Date  . Meningitis spinal 2012  . Chicken pox   . Genital warts   . Colon polyp   . GERD (gastroesophageal reflux disease)     She has past surgical history that includes Cesarean section ('92, '01) and Breast surgery ('84).   Her family history is not on file.She reports that she has never smoked. She does not have any smokeless tobacco history on file. She reports that she drinks alcohol. She reports that she does not use illicit drugs.  No current outpatient prescriptions on file prior to visit.   No current facility-administered medications on file prior to visit.     Objective:  Objective Physical Exam  Constitutional: She is oriented to person, place, and time. She appears well-developed and well-nourished. No distress.  HENT:  Head: Normocephalic and atraumatic.  Right Ear: External ear normal.  Left Ear: External ear normal.  Nose: Nose normal.  Mouth/Throat: Oropharynx is clear and moist.  Eyes: Conjunctivae and EOM are normal. Pupils are equal, round, and reactive to light.  Neck: Normal range of motion. Neck supple. No JVD present. Carotid bruit is not present. No thyromegaly  present.  Cardiovascular: Normal rate, regular rhythm and normal heart sounds.   No murmur heard. Pulmonary/Chest: Effort normal and breath sounds normal. No respiratory distress. She has no wheezes. She has no rales. She exhibits no tenderness.  Musculoskeletal: She exhibits no edema or tenderness.  Neurological: She is alert and oriented to person, place, and time.  Skin: Skin is warm and dry. No rash noted. No erythema. No pallor.  Psychiatric: She has a normal mood and affect. Her behavior is normal. Judgment and thought content normal.   BP 118/82 mmHg  Pulse 95  Temp(Src) 98.1 F (36.7 C) (Oral)  Ht 5\' 8"  (1.727 m)  Wt 206 lb (93.441 kg)  BMI 31.33 kg/m2  SpO2 98% Wt Readings from Last 3 Encounters:  07/27/15 206 lb (93.441 kg)  06/22/15 214 lb (97.07 kg)  05/14/15 219 lb 12.8 oz (99.701 kg)     No results found for: WBC, HGB, HCT, PLT, GLUCOSE, CHOL, TRIG, HDL, LDLDIRECT, LDLCALC, ALT, AST, NA, K, CL, CREATININE, BUN, CO2, TSH, PSA, INR, GLUF, HGBA1C, MICROALBUR  No results found.   Assessment & Plan:  Plan I have discontinued Emily [name]'s phentermine. I am also having her maintain her Naltrexone-Bupropion HCl ER.  Meds ordered this encounter  Medications  . DISCONTD: Naltrexone-Bupropion HCl ER (CONTRAVE) 8-90 MG TB12    Sig: 2 po bid    Dispense:  120 tablet    Refill:  3  . Naltrexone-Bupropion HCl ER (CONTRAVE) 8-90 MG TB12    Sig: 2 po bid    Dispense:  120 tablet    Refill:  3    Problem List Items Addressed This  Visit    None    Visit Diagnoses    Preventative health care    -  Primary    Relevant Orders    Basic metabolic panel    CBC with Differential/Platelet    Hepatic function panel    Lipid panel    POCT urinalysis dipstick    TSH    Obesity        Relevant Medications    Naltrexone-Bupropion HCl ER (CONTRAVE) 8-90 MG TB12       Follow-up: Return in about 1 year (around 07/26/2016), or if symptoms worsen or fail to improve, for  fasting.  Garnet Koyanagi, DO      Subjective:     Emily [name] is a 49 y.o. female and is here for a comprehensive physical exam. The patient reports no problems.  History   Social History  . Marital Status: Married    Spouse Name: N/A  . Number of Children: N/A  . Years of Education: N/A   Occupational History  .      house wife   Social History Main Topics  . Smoking status: Never Smoker   . Smokeless tobacco: Not on file  . Alcohol Use: 0.0 oz/week    0 Standard drinks or equivalent per week  . Drug Use: No  . Sexual Activity:    Partners: Male   Other Topics Concern  . Not on file   Social History Narrative   Exercise-- no   Health Maintenance  Topic Date Due  . Samul Dada  02/24/1983  . HIV Screening  04/09/2016 (Originally 02/24/1979)  . INFLUENZA VACCINE  07/30/2015  . MAMMOGRAM  12/30/2015  . PAP SMEAR  12/29/2016  . COLONOSCOPY  12/29/2024    The following portions of the patient's history were reviewed and updated as appropriate:  She  has a past medical history of Meningitis spinal (2012); Chicken pox; Genital warts; Colon polyp; and GERD (gastroesophageal reflux disease). She  does not have any pertinent problems on file. She  has past surgical history that includes Cesarean section ('92, '01) and Breast surgery ('84). Her family history is not on file. She  reports that she has never smoked. She does not have any smokeless tobacco history on file. She reports that she drinks alcohol. She reports that she does not use illicit drugs. She currently has no medications in their medication list. No current outpatient prescriptions on file prior to visit.   No current facility-administered medications on file prior to visit.   She is allergic to other..  Review of Systems Review of Systems  Constitutional: Negative for activity change, appetite change and fatigue.  HENT: Negative for hearing loss, congestion, tinnitus and ear discharge.  dentist  q92m Eyes: Negative for visual disturbance (see optho q1y -- vision corrected to 20/20 with glasses).  Respiratory: Negative for cough, chest tightness and shortness of breath.   Cardiovascular: Negative for chest pain, palpitations and leg swelling.  Gastrointestinal: Negative for abdominal pain, diarrhea, constipation and abdominal distention.  Genitourinary: Negative for urgency, frequency, decreased urine volume and difficulty urinating.  Musculoskeletal: Negative for back pain, arthralgias and gait problem.  Skin: Negative for color change, pallor and rash.  Neurological: Negative for dizziness, light-headedness, numbness and headaches.  Hematological: Negative for adenopathy. Does not bruise/bleed easily.  Psychiatric/Behavioral: Negative for suicidal ideas, confusion, sleep disturbance, self-injury, dysphoric mood, decreased concentration and agitation.       Objective:    BP 118/82 mmHg  Pulse 95  Temp(Src)  98.1 F (36.7 C) (Oral)  Ht 5\' 8"  (1.727 m)  Wt 206 lb (93.441 kg)  BMI 31.33 kg/m2  SpO2 98% General appearance: alert, cooperative, appears stated age and no distress Head: Normocephalic, without obvious abnormality, atraumatic Eyes: conjunctivae/corneas clear. PERRL, EOM's intact. Fundi benign. Ears: normal TM's and external ear canals both ears Nose: Nares normal. Septum midline. Mucosa normal. No drainage or sinus tenderness. Throat: lips, mucosa, and tongue normal; teeth and gums normal Neck: no adenopathy, no carotid bruit, no JVD, supple, symmetrical, trachea midline and thyroid not enlarged, symmetric, no tenderness/mass/nodules Back: symmetric, no curvature. ROM normal. No CVA tenderness. Lungs: clear to auscultation bilaterally Breasts: gyn Heart: regular rate and rhythm, S1, S2 normal, no murmur, click, rub or gallop Abdomen: soft, non-tender; bowel sounds normal; no masses,  no organomegaly Pelvic: deferred--gyn Extremities: extremities normal,  atraumatic, no cyanosis or edema Pulses: 2+ and symmetric Skin: Skin color, texture, turgor normal. No rashes or lesions Lymph nodes: Cervical, supraclavicular, and axillary nodes normal. Neurologic: Alert and oriented X 3, normal strength and tone. Normal symmetric reflexes. Normal coordination and gait Psych- no depression, no anxiety      Assessment:    Healthy female exam.       Plan:    ghm utd Check labs See After Visit Summary for Counseling Recommendations    1. Preventative health care  - Basic metabolic panel - CBC with Differential/Platelet - Hepatic function panel - Lipid panel - POCT urinalysis dipstick - TSH  2. Obesity  - Naltrexone-Bupropion HCl ER (CONTRAVE) 8-90 MG TB12; 2 po bid  Dispense: 120 tablet; Refill: 3  3. Need for diphtheria-tetanus-pertussis (Tdap) vaccine, adult/adolescent  - Tdap vaccine greater than or equal to 7yo IM

## 2015-08-01 ENCOUNTER — Telehealth: Payer: Self-pay | Admitting: Family Medicine

## 2015-08-01 NOTE — Telephone Encounter (Signed)
Patient returning your call best # [phone]

## 2015-08-01 NOTE — Telephone Encounter (Signed)
Called patient at [phone] [facility]) *Preferred* and left message to return call when available.

## 2015-08-01 NOTE — Telephone Encounter (Signed)
Patient states that since she started with the Contrave she has been having constipation, nausea, and insomnia (up until 3 and 4 in the morning).  She would like to know if she can change to another medication please.

## 2015-08-01 NOTE — Telephone Encounter (Signed)
Pt is requesting call back regarding a prescription. She has questions about the medication.

## 2015-08-02 MED ORDER — LORCASERIN HCL 10 MG PO TABS: 1.0000 | ORAL_TABLET | Freq: Two times a day (BID) | ORAL | Status: DC

## 2015-08-02 NOTE — Telephone Encounter (Signed)
Rx faxed to the [facility], VM left making the patient aware and to stop the contrave.      KP

## 2015-08-02 NOTE — Telephone Encounter (Signed)
D/c contrave----  belviq 10 mg 1 po bid #60   Ov in 1 month

## 2015-09-27 ENCOUNTER — Telehealth: Payer: Self-pay | Admitting: Family Medicine

## 2015-09-27 ENCOUNTER — Ambulatory Visit (INDEPENDENT_AMBULATORY_CARE_PROVIDER_SITE_OTHER): Payer: 59 | Admitting: Family Medicine

## 2015-09-27 ENCOUNTER — Encounter: Payer: Self-pay | Admitting: Family Medicine

## 2015-09-27 VITALS — BP 124/78 | HR 69 | Temp 98.2°F | Ht 68.0 in | Wt 207.4 lb

## 2015-09-27 DIAGNOSIS — N946 Dysmenorrhea, unspecified: Secondary | ICD-10-CM | POA: Diagnosis not present

## 2015-09-27 DIAGNOSIS — E669 Obesity, unspecified: Secondary | ICD-10-CM | POA: Insufficient documentation

## 2015-09-27 DIAGNOSIS — R109 Unspecified abdominal pain: Secondary | ICD-10-CM

## 2015-09-27 DIAGNOSIS — Z0000 Encounter for general adult medical examination without abnormal findings: Secondary | ICD-10-CM

## 2015-09-27 LAB — LIPID PANEL
Cholesterol: 207 mg/dL — ABNORMAL HIGH (ref 0–200)
HDL: 48.7 mg/dL (ref >39.00)
LDL Cholesterol: 124 mg/dL — ABNORMAL HIGH (ref 0–99)
NONHDL: 157.81
Total CHOL/HDL Ratio: 4
Triglycerides: 167 mg/dL — ABNORMAL HIGH (ref 0.0–149.0)
VLDL: 33.4 mg/dL (ref 0.0–40.0)

## 2015-09-27 LAB — TSH: TSH: 1.87 u[IU]/mL (ref 0.35–4.50)

## 2015-09-27 MED ORDER — TRAMADOL HCL 50 MG PO TABS: 50.0000 mg | ORAL_TABLET | Freq: Three times a day (TID) | ORAL | Status: DC | PRN

## 2015-09-27 MED ORDER — KETOROLAC TROMETHAMINE 60 MG/2ML IM SOLN
60.0000 mg | Freq: Once | INTRAMUSCULAR | Status: AC
  Administered 2015-09-27: 60 mg via INTRAMUSCULAR

## 2015-09-27 MED ORDER — LORCASERIN HCL 10 MG PO TABS: 1.0000 | ORAL_TABLET | Freq: Two times a day (BID) | ORAL | Status: DC

## 2015-09-27 NOTE — Progress Notes (Signed)
Patient ID: Emily [name], female    DOB: 1966/05/15  Age: 49 y.o. MRN: [id number]    Subjective:  Subjective HPI Emily [name] presents for f/u belviq.  She was only taking it one time a day.  She is also c/o menstrual cramps.  She has an appointment with a new gyn next month but can not tolerate the cramps      Review of Systems  Constitutional: Negative for diaphoresis, appetite change, fatigue and unexpected weight change.  Eyes: Negative for pain, redness and visual disturbance.  Respiratory: Negative for cough, chest tightness, shortness of breath and wheezing.   Cardiovascular: Negative for chest pain, palpitations and leg swelling.  Endocrine: Negative for cold intolerance, heat intolerance, polydipsia, polyphagia and polyuria.  Genitourinary: Negative for dysuria, frequency and difficulty urinating.  Neurological: Negative for dizziness, light-headedness, numbness and headaches.    History Past Medical History  Diagnosis Date  . Meningitis spinal 2012  . Chicken pox   . Genital warts   . Colon polyp   . GERD (gastroesophageal reflux disease)     She has past surgical history that includes Cesarean section ('92, '01) and Breast surgery ('84).   Her family history is not on file.She reports that she has never smoked. She does not have any smokeless tobacco history on file. She reports that she drinks alcohol. She reports that she does not use illicit drugs.  No current outpatient prescriptions on file prior to visit.   No current facility-administered medications on file prior to visit.     Objective:  Objective Physical Exam  Constitutional: She is oriented to person, place, and time. She appears well-developed and well-nourished.  HENT:  Head: Normocephalic and atraumatic.  Eyes: Conjunctivae and EOM are normal.  Neck: Normal range of motion. Neck supple. No JVD present. Carotid bruit is not present. No thyromegaly present.  Cardiovascular: Normal rate, regular  rhythm and normal heart sounds.   No murmur heard. Pulmonary/Chest: Effort normal and breath sounds normal. No respiratory distress. She has no wheezes. She has no rales. She exhibits no tenderness.  Musculoskeletal: She exhibits no edema.  Neurological: She is alert and oriented to person, place, and time.  Psychiatric: She has a normal mood and affect. Her behavior is normal.   BP 124/78 mmHg  Pulse 69  Temp(Src) 98.2 F (36.8 C) (Oral)  Ht 5\' 8"  (1.727 m)  Wt 207 lb 6.4 oz (94.076 kg)  BMI 31.54 kg/m2  SpO2 99% Wt Readings from Last 3 Encounters:  09/27/15 207 lb 6.4 oz (94.076 kg)  07/27/15 206 lb (93.441 kg)  06/22/15 214 lb (97.07 kg)     Lab Results  Component Value Date   CHOL 207* 09/27/2015   TRIG 167.0* 09/27/2015   HDL 48.70 09/27/2015   LDLCALC 124* 09/27/2015   TSH 1.87 09/27/2015    No results found.   Assessment & Plan:  Plan I have discontinued [name]'s Naltrexone-Bupropion HCl ER. I am also having her start on traMADol. Additionally, I am having her maintain her norethindrone-ethinyl estradiol and Lorcaserin HCl. We administered ketorolac.  Meds ordered this encounter  Medications  . norethindrone-ethinyl estradiol (MICROGESTIN,JUNEL,LOESTRIN) 1-20 MG-MCG tablet    Sig: Take 1 tablet by mouth daily.  . Lorcaserin HCl 10 MG TABS    Sig: Take 1 tablet by mouth 2 (two) times daily.    Dispense:  60 tablet    Refill:  0  . traMADol (ULTRAM) 50 MG tablet    Sig: Take 1 tablet (  50 mg total) by mouth every 8 (eight) hours as needed.    Dispense:  30 tablet    Refill:  0  . ketorolac (TORADOL) injection 60 mg    Sig:     Problem List Items Addressed This Visit    Morbid obesity    Refill belviq con't diet and exercise      Relevant Medications   Lorcaserin HCl 10 MG TABS   Dysmenorrhea    toradol IM Ultram  F/u gyn       Relevant Medications   traMADol (ULTRAM) 50 MG tablet    Other Visit Diagnoses    dysmenorrhea    -  Primary     Relevant Medications    ketorolac (TORADOL) injection 60 mg (Completed)    Preventative health care        Relevant Orders    TSH (Completed)    Lipid panel (Completed)       Follow-up: Return if symptoms worsen or fail to improve, for weight check.  Garnet Koyanagi, DO

## 2015-09-27 NOTE — Assessment & Plan Note (Signed)
Refill belviq con't diet and exercise

## 2015-09-27 NOTE — Assessment & Plan Note (Signed)
toradol IM Ultram  F/u gyn

## 2015-09-27 NOTE — Progress Notes (Signed)
Pre visit review using our clinic review tool, if applicable. No additional management support is needed unless otherwise documented below in the visit note. 

## 2015-09-27 NOTE — Telephone Encounter (Signed)
Pt called stating she was advised to call if shot and pain meds were not working. Transferred to [place].

## 2015-09-27 NOTE — Patient Instructions (Signed)

## 2015-09-28 ENCOUNTER — Telehealth: Payer: Self-pay | Admitting: Family Medicine

## 2015-09-28 ENCOUNTER — Other Ambulatory Visit: Payer: Self-pay | Admitting: Family Medicine

## 2015-09-28 DIAGNOSIS — R102 Pelvic and perineal pain: Secondary | ICD-10-CM

## 2015-09-28 DIAGNOSIS — N946 Dysmenorrhea, unspecified: Secondary | ICD-10-CM

## 2015-09-28 NOTE — Telephone Encounter (Signed)
Pt called back in requesting to just speak with someone instead. I offered to make an appt, pt wants to wait until she speak with someone first to advise.     Thanks.   CB:# [phone]

## 2015-09-28 NOTE — Telephone Encounter (Signed)
Spoke with patient and she verbalized understanding. The order has been placed.       KP

## 2015-09-28 NOTE — Telephone Encounter (Signed)
Caller name: Malasia Stoffer  Relationship to patient: Self   Can be reached: [phone]  Pharmacy: [facility]   Reason for call: pt says that she came in yesterday and was prescribed a pain medication and also received a shot for her pain. Pt says that she was told to call back in if the medicine or shot doesn't work. Pt says that if another Rx is called in she would like to have it go to the above pharmacy instead.  Please advise.

## 2015-09-28 NOTE — Telephone Encounter (Signed)
She also said she had a pending apt with the GYN but it is Oct 24th. She had a Hx of he intestine being on top of her ovary due to a C-section 15 years ago. She said she did not tell you about that because she just thought about it. Hx of intestinal adhesions and scar tissue.  Please advise      KP

## 2015-09-28 NOTE — Telephone Encounter (Signed)
Spoke with patient and she said she is still having the pain and she feels like something is swollen in her pelvis, but her GYN told her the exam was normal but did nit do an [place]. She said she has been on her period for a week but the pain for 15 days. She is not feeling well and has not left the house in 4 days. She wants to know what you suggest. Please advise       KP

## 2015-09-28 NOTE — Telephone Encounter (Signed)
Ok to order US pelvis

## 2015-10-03 ENCOUNTER — Ambulatory Visit (HOSPITAL_BASED_OUTPATIENT_CLINIC_OR_DEPARTMENT_OTHER)
Admission: RE | Admit: 2015-10-03 | Discharge: 2015-10-03 | Disposition: A | Discharge status: Home / Self Care | Payer: 59 | Source: Ambulatory Visit | Attending: Family Medicine | Admitting: Family Medicine

## 2015-10-03 DIAGNOSIS — N839 Noninflammatory disorder of ovary, fallopian tube and broad ligament, unspecified: Secondary | ICD-10-CM | POA: Diagnosis not present

## 2015-10-03 DIAGNOSIS — R102 Pelvic and perineal pain: Secondary | ICD-10-CM

## 2015-10-03 DIAGNOSIS — N946 Dysmenorrhea, unspecified: Secondary | ICD-10-CM | POA: Diagnosis not present

## 2015-10-03 DIAGNOSIS — R938 Abnormal findings on diagnostic imaging of other specified body structures: Secondary | ICD-10-CM | POA: Insufficient documentation

## 2015-10-04 ENCOUNTER — Other Ambulatory Visit: Payer: Self-pay | Admitting: Family Medicine

## 2015-10-04 DIAGNOSIS — N946 Dysmenorrhea, unspecified: Secondary | ICD-10-CM

## 2015-10-05 ENCOUNTER — Telehealth: Payer: Self-pay | Admitting: Family Medicine

## 2015-10-05 MED ORDER — HYDROCODONE-ACETAMINOPHEN 5-325 MG PO TABS: 1.0000 | ORAL_TABLET | Freq: Three times a day (TID) | ORAL | Status: DC | PRN

## 2015-10-05 NOTE — Telephone Encounter (Signed)
-----   Message from Rosalita Chessman, DO sent at 10/04/2015  9:42 AM EDT ----- ? Thickened endometrial thickening--- we need to refer you to a gyn for further evaluation of this

## 2015-10-05 NOTE — Telephone Encounter (Signed)
Relation to pt: self Call back number:[phone]   Reason for call:  Patient inquiring about ultrasound results and would like to speak with someone today. Patient states she is confused to why she received a call from a GYN office scheudling appt. Please advise

## 2015-10-05 NOTE — Telephone Encounter (Signed)
Pt states she has an appt with her GYN 10/24 and next menses is scheduled to come next week. Pt would like to know if she can get something for pain before her cycle starts/ States the medications that were rx'd didn't touch her pain. Talked to Dr. Etter Sjogren who approved Vicodn 5/325 rx filled and at front desk for patient

## 2015-11-20 ENCOUNTER — Encounter: Payer: Self-pay | Admitting: Physician Assistant

## 2015-11-20 ENCOUNTER — Ambulatory Visit (INDEPENDENT_AMBULATORY_CARE_PROVIDER_SITE_OTHER): Payer: 59 | Admitting: Physician Assistant

## 2015-11-20 VITALS — BP 116/82 | HR 73 | Temp 98.1°F | Wt 206.4 lb

## 2015-11-20 DIAGNOSIS — Z23 Encounter for immunization: Secondary | ICD-10-CM

## 2015-11-20 MED ORDER — PHENTERMINE HCL 37.5 MG PO CAPS: 37.5000 mg | ORAL_CAPSULE | ORAL | Status: DC

## 2015-11-20 NOTE — Progress Notes (Signed)
Pre visit review using our clinic review tool, if applicable. No additional management support is needed unless otherwise documented below in the visit note. 

## 2015-11-20 NOTE — Progress Notes (Signed)
Patient presents to clinic today for follow-up regarding medical management for obesity. This is this provider's first time seeing patient. Patient currently on Belviq for maintenance of weight loss. Patient endorses Belviq daily until last week when she ran out of the medication. Endorses weight has not improved with medication. Is not doing anything for exercise currently. Does stay active at work and with household regimen. Is watching what she eats overall. Is hard sometimes due to busy lifestyle.  Body mass index is 31.39 kg/(m^2).  Past Medical History  Diagnosis Date  . Meningitis spinal 2012  . Chicken pox   . Genital warts   . Colon polyp   . GERD (gastroesophageal reflux disease)     Current Outpatient Prescriptions on File Prior to Visit  Medication Sig Dispense Refill  . HYDROcodone-acetaminophen (NORCO/VICODIN) 5-325 MG tablet Take 1 tablet by mouth every 8 (eight) hours as needed for moderate pain. 30 tablet 0  . Lorcaserin HCl 10 MG TABS Take 1 tablet by mouth 2 (two) times daily. 60 tablet 0  . traMADol (ULTRAM) 50 MG tablet Take 1 tablet (50 mg total) by mouth every 8 (eight) hours as needed. 30 tablet 0   No current facility-administered medications on file prior to visit.    Allergies  Allergen Reactions  . Other Other (See Comments)    Red meat- abdominal pain    No family history on file.  Social History   Social History  . Marital Status: Married    Spouse Name: N/A  . Number of Children: N/A  . Years of Education: N/A   Occupational History  .      house wife   Social History Main Topics  . Smoking status: Never Smoker   . Smokeless tobacco: None  . Alcohol Use: 0.0 oz/week    0 Standard drinks or equivalent per week  . Drug Use: No  . Sexual Activity:    Partners: Male   Other Topics Concern  . None   Social History Narrative   Exercise-- no   Review of Systems - See HPI.  All other ROS are negative.  BP 116/82 mmHg  Pulse 73   Temp(Src) 98.1 F (36.7 C) (Oral)  Wt 206 lb 6.4 oz (93.622 kg)  SpO2 98%  Physical Exam  Constitutional: She is oriented to person, place, and time and well-developed, well-nourished, and in no distress.  HENT:  Head: Normocephalic and atraumatic.  Cardiovascular: Normal rate, normal heart sounds and intact distal pulses.   Pulmonary/Chest: Effort normal.  Neurological: She is alert and oriented to person, place, and time.    Recent Results (from the past 2160 hour(s))  TSH     Status: None   Collection Time: 09/27/15 10:43 AM  Result Value Ref Range   TSH 1.87 0.35 - 4.50 uIU/mL  Lipid panel     Status: Abnormal   Collection Time: 09/27/15 10:43 AM  Result Value Ref Range   Cholesterol 207 (H) 0 - 200 mg/dL    Comment: ATP III Classification       Desirable:  < 200 mg/dL               Borderline High:  200 - 239 mg/dL          High:  > = 240 mg/dL   Triglycerides 167.0 (H) 0.0 - 149.0 mg/dL    Comment: Normal:  <150 mg/dLBorderline High:  150 - 199 mg/dL   HDL 48.70 >39.00 mg/dL   VLDL  33.4 0.0 - 40.0 mg/dL   LDL Cholesterol 124 (H) 0 - 99 mg/dL   Total CHOL/HDL Ratio 4     Comment:                Men          Women1/2 Average Risk     3.4          3.3Average Risk          5.0          4.42X Average Risk          9.6          7.13X Average Risk          15.0          11.0                       NonHDL 157.81     Comment: NOTE:  Non-HDL goal should be 30 mg/dL higher than patient's LDL goal (i.e. LDL goal of < 70 mg/dL, would have non-HDL goal of < 100 mg/dL)    Assessment/Plan: Morbid obesity No change in weight with several months of Belviq. Has been off of Phentermine for some time. Will restart at 37.5 mg over next month. Patient to continue diet and exercise. Follow-up with PCP in 1 month.

## 2015-11-20 NOTE — Assessment & Plan Note (Signed)
No change in weight with several months of Belviq. Has been off of Phentermine for some time. Will restart at 37.5 mg over next month. Patient to continue diet and exercise. Follow-up with PCP in 1 month.

## 2015-11-20 NOTE — Patient Instructions (Signed)
Please start the phentermine as directed instead of Belviq. Continue chronic medications. Follow-up with Dr. Etter Sjogren in 1 month.  Get an over-the-counter Glucosamine-Chondroitin supplement as directed.

## 2015-12-20 ENCOUNTER — Ambulatory Visit (INDEPENDENT_AMBULATORY_CARE_PROVIDER_SITE_OTHER): Payer: 59 | Admitting: Family Medicine

## 2015-12-20 ENCOUNTER — Encounter: Payer: Self-pay | Admitting: Family Medicine

## 2015-12-20 DIAGNOSIS — L659 Nonscarring hair loss, unspecified: Secondary | ICD-10-CM | POA: Insufficient documentation

## 2015-12-20 DIAGNOSIS — E785 Hyperlipidemia, unspecified: Secondary | ICD-10-CM | POA: Diagnosis not present

## 2015-12-20 MED ORDER — PHENTERMINE HCL 37.5 MG PO CAPS: 37.5000 mg | ORAL_CAPSULE | ORAL | Status: DC

## 2015-12-20 NOTE — Patient Instructions (Signed)
Obesity Obesity is defined as having too much total body fat and a body mass index (BMI) of 30 or more. BMI is an estimate of body fat and is calculated from your height and weight. BMI is typically calculated by your health care provider during regular wellness visits. Obesity happens when you consume more calories than you can burn by exercising or performing daily physical tasks. Prolonged obesity can cause major illnesses or emergencies, such as:  Stroke.  Heart disease.  Diabetes.  Cancer.  Arthritis.  High blood pressure (hypertension).  High cholesterol.  Sleep apnea.  Erectile dysfunction.  Infertility problems. CAUSES   Regularly eating unhealthy foods.  Physical inactivity.  Certain disorders, such as an underactive thyroid (hypothyroidism), Cushing's syndrome, and polycystic ovarian syndrome.  Certain medicines, such as steroids, some depression medicines, and antipsychotics.  Genetics.  Lack of sleep. DIAGNOSIS A health care provider can diagnose obesity after calculating your BMI. Obesity will be diagnosed if your BMI is 30 or higher. There are other methods of measuring obesity levels. Some other methods include measuring your skinfold thickness, your waist circumference, and comparing your hip circumference to your waist circumference. TREATMENT  A healthy treatment program includes some or all of the following:  Long-term dietary changes.  Exercise and physical activity.  Behavioral and lifestyle changes.  Medicine only under the supervision of your health care provider. Medicines may help, but only if they are used with diet and exercise programs. If your BMI is 40 or higher, your health care provider may recommend specialized surgery or programs to help with weight loss. An unhealthy treatment program includes:  Fasting.  Fad diets.  Supplements and drugs. These choices do not succeed in long-term weight control. HOME CARE  INSTRUCTIONS  Exercise and perform physical activity as directed by your health care provider. To increase physical activity, try the following:  Use stairs instead of elevators.  Park farther away from store entrances.  Garden, bike, or walk instead of watching television or using the computer.  Eat healthy, low-calorie foods and drinks on a regular basis. Eat more fruits and vegetables. Use low-calorie cookbooks or take healthy cooking classes.  Limit fast food, sweets, and processed snack foods.  Eat smaller portions.  Keep a daily journal of everything you eat. There are many free websites to help you with this. It may be helpful to measure your foods so you can determine if you are eating the correct portion sizes.  Avoid drinking alcohol. Drink more water and drinks without calories.  Take vitamins and supplements only as recommended by your health care provider.  Weight-loss support groups, registered dietitians, counselors, and stress reduction education can also be very helpful. SEEK IMMEDIATE MEDICAL CARE IF:  You have chest pain or tightness.  You have trouble breathing or feel short of breath.  You have weakness or leg numbness.  You feel confused or have trouble talking.  You have sudden changes in your vision.   This information is not intended to replace advice given to you by your health care provider. Make sure you discuss any questions you have with your health care provider.   Document Released: 01/22/2005 Document Revised: 01/05/2015 Document Reviewed: 01/21/2012 Elsevier Interactive Patient Education 2016 [employer]  

## 2015-12-20 NOTE — Assessment & Plan Note (Signed)
Discussed diet and exercise Refill  med

## 2015-12-20 NOTE — Progress Notes (Deleted)
   Subjective:    Patient ID: Emily [name], female    DOB: April 07, 1966, 49 y.o.   MRN: [id number]  HPI    Review of Systems     Objective:   Physical Exam        Assessment & Plan:

## 2015-12-20 NOTE — Assessment & Plan Note (Signed)
It is improving Check labs Recommended biotin or vita hair and nail formula Refer to derm if no better

## 2015-12-20 NOTE — Progress Notes (Signed)
Pre visit review using our clinic review tool, if applicable. No additional management support is needed unless otherwise documented below in the visit note. 

## 2015-12-20 NOTE — Progress Notes (Signed)
Patient ID: Emily [name], female    DOB: 1966-10-24  Age: 49 y.o. MRN: [id number]    Subjective:  Subjective HPI [name] presents for f/u obesity,cholesterol .  She also c/o hair loss and thought it may be from belviq or thyroid.  It has lightened up some.    Review of Systems  Constitutional: Negative for diaphoresis, appetite change, fatigue and unexpected weight change.  Eyes: Negative for pain, redness and visual disturbance.  Respiratory: Negative for cough, chest tightness, shortness of breath and wheezing.   Cardiovascular: Negative for chest pain, palpitations and leg swelling.  Endocrine: Negative for cold intolerance, heat intolerance, polydipsia, polyphagia and polyuria.  Genitourinary: Negative for dysuria, frequency and difficulty urinating.  Neurological: Negative for dizziness, light-headedness, numbness and headaches.    History Past Medical History  Diagnosis Date  . Meningitis spinal 2012  . Chicken pox   . Genital warts   . Colon polyp   . GERD (gastroesophageal reflux disease)     She has past surgical history that includes Cesarean section ('92, '01) and Breast surgery ('84).   Her family history is not on file.She reports that she has never smoked. She does not have any smokeless tobacco history on file. She reports that she drinks alcohol. She reports that she does not use illicit drugs.  Current Outpatient Prescriptions on File Prior to Visit  Medication Sig Dispense Refill  . cholecalciferol (VITAMIN D) 1000 UNITS tablet Take 1,000 Units by mouth daily.     No current facility-administered medications on file prior to visit.     Objective:  Objective Physical Exam  Constitutional: She is oriented to person, place, and time. She appears well-developed and well-nourished.  HENT:  Head: Normocephalic and atraumatic.  Eyes: Conjunctivae and EOM are normal.  Neck: Normal range of motion. Neck supple. No JVD present. Carotid bruit is not  present. No thyromegaly present.  Cardiovascular: Normal rate, regular rhythm and normal heart sounds.   No murmur heard. Pulmonary/Chest: Effort normal and breath sounds normal. No respiratory distress. She has no wheezes. She has no rales. She exhibits no tenderness.  Musculoskeletal: She exhibits no edema.  Neurological: She is alert and oriented to person, place, and time.  Psychiatric: She has a normal mood and affect. Her behavior is normal.  Nursing note and vitals reviewed.  BP 120/82 mmHg  Pulse 78  Temp(Src) 98.4 F (36.9 C) (Oral)  Ht 5' 8"  (1.727 m)  Wt 201 lb 3.2 oz (91.264 kg)  BMI 30.60 kg/m2  SpO2 99% Wt Readings from Last 3 Encounters:  12/20/15 201 lb 3.2 oz (91.264 kg)  11/20/15 206 lb 6.4 oz (93.622 kg)  09/27/15 207 lb 6.4 oz (94.076 kg)     Lab Results  Component Value Date   CHOL 207* 09/27/2015   TRIG 167.0* 09/27/2015   HDL 48.70 09/27/2015   LDLCALC 124* 09/27/2015   TSH 1.87 09/27/2015    US Transvaginal Non-ob  10/03/2015  CLINICAL DATA:  Pelvic cramping.  Dysmenorrhea. EXAM: TRANSABDOMINAL AND TRANSVAGINAL ULTRASOUND OF PELVIS TECHNIQUE: Both transabdominal and transvaginal ultrasound examinations of the pelvis were performed. Transabdominal technique was performed for global imaging of the pelvis including uterus, ovaries, adnexal regions, and pelvic cul-de-sac. It was necessary to proceed with endovaginal exam following the transabdominal exam to visualize the endometrium. COMPARISON:  None FINDINGS: Uterus Measurements: 10.7 x 5.4 x 6.5 cm. Multiple nabothian cysts. No fibroids or other mass visualized. Endometrium Thickness: 8 mm. 10 mm focal endometrial thickening and a  small amount of fluid. Right ovary Not visualized. Left ovary Measurements: 2.9 x 1.8 x 1.9 cm. Homogeneously hypoechoic, avascular left ovarian mass measuring 2.3 x 1.4 x 1.2 cm which may represent endometrioma or hemorrhagic cyst. Other findings Trace pelvic free fluid likely  physiologic. IMPRESSION: 1. Small 10 mm echogenic focal area of endometrial thickening seen transabdominally, but not appreciated transvaginally. Consider further evaluation with sonohysterogram for confirmation. Endometrial sampling should also be considered if patient is at high risk for endometrial carcinoma. (Ref: Radiological Reasoning: Algorithmic Workup of Abnormal Vaginal Bleeding with Endovaginal Sonography and Sonohysterography. AJR 2008; 440:N02-72) 2. Homogeneously hypoechoic, avascular left ovarian mass measuring 2.3 x 1.4 x 1.2 cm which may represent endometrioma or hemorrhagic cyst. Electronically Signed   By: Kathreen Devoid   On: 10/03/2015 11:32   US Pelvis Complete  10/03/2015  CLINICAL DATA:  Pelvic cramping.  Dysmenorrhea. EXAM: TRANSABDOMINAL AND TRANSVAGINAL ULTRASOUND OF PELVIS TECHNIQUE: Both transabdominal and transvaginal ultrasound examinations of the pelvis were performed. Transabdominal technique was performed for global imaging of the pelvis including uterus, ovaries, adnexal regions, and pelvic cul-de-sac. It was necessary to proceed with endovaginal exam following the transabdominal exam to visualize the endometrium. COMPARISON:  None FINDINGS: Uterus Measurements: 10.7 x 5.4 x 6.5 cm. Multiple nabothian cysts. No fibroids or other mass visualized. Endometrium Thickness: 8 mm. 10 mm focal endometrial thickening and a small amount of fluid. Right ovary Not visualized. Left ovary Measurements: 2.9 x 1.8 x 1.9 cm. Homogeneously hypoechoic, avascular left ovarian mass measuring 2.3 x 1.4 x 1.2 cm which may represent endometrioma or hemorrhagic cyst. Other findings Trace pelvic free fluid likely physiologic. IMPRESSION: 1. Small 10 mm echogenic focal area of endometrial thickening seen transabdominally, but not appreciated transvaginally. Consider further evaluation with sonohysterogram for confirmation. Endometrial sampling should also be considered if patient is at high risk for  endometrial carcinoma. (Ref: Radiological Reasoning: Algorithmic Workup of Abnormal Vaginal Bleeding with Endovaginal Sonography and Sonohysterography. AJR 2008; 536:U44-03) 2. Homogeneously hypoechoic, avascular left ovarian mass measuring 2.3 x 1.4 x 1.2 cm which may represent endometrioma or hemorrhagic cyst. Electronically Signed   By: Kathreen Devoid   On: 10/03/2015 11:32     Assessment & Plan:  Plan I have discontinued Ms. Beightol's Lorcaserin HCl, traMADol, and HYDROcodone-acetaminophen. I am also having her maintain her cholecalciferol, glucosamine-chondroitin, and phentermine.  Meds ordered this encounter  Medications  . glucosamine-chondroitin 500-400 MG tablet    Sig: Take 1 tablet by mouth 3 (three) times daily.  Marland Kitchen DISCONTD: phentermine 37.5 MG capsule    Sig: Take 1 capsule (37.5 mg total) by mouth every morning.    Dispense:  30 capsule    Refill:  0  . phentermine 37.5 MG capsule    Sig: Take 1 capsule (37.5 mg total) by mouth every morning.    Dispense:  30 capsule    Refill:  0    Problem List Items Addressed This Visit      Unprioritized   Morbid obesity ([place]) - Primary    Discussed diet and exercise Refill  med      Relevant Medications   phentermine 37.5 MG capsule   Hair loss    It is improving Check labs Recommended biotin or vita hair and nail formula Refer to derm if no better      Relevant Orders   Thyroid Panel With TSH    Other Visit Diagnoses    Hyperlipidemia        Relevant Orders  Comp Met (CMET)    Lipid panel       Follow-up: Return in about 4 weeks (around 01/17/2016), or if symptoms worsen or fail to improve.  Garnet Koyanagi, DO

## 2015-12-26 ENCOUNTER — Other Ambulatory Visit: Payer: 59

## 2016-01-15 ENCOUNTER — Telehealth: Payer: Self-pay | Admitting: Family Medicine

## 2016-01-15 ENCOUNTER — Ambulatory Visit: Payer: 59 | Admitting: Family Medicine

## 2016-01-15 NOTE — Telephone Encounter (Signed)
error:[id number] ° °

## 2016-01-16 ENCOUNTER — Telehealth: Payer: Self-pay

## 2016-01-16 NOTE — Telephone Encounter (Signed)
No charge. 

## 2016-01-16 NOTE — Telephone Encounter (Signed)
Patient called about a potential no show fee for jan 17th appointment. She states she did not know until the last minute that her son would not have a ride, she said she was not aware of the no show fee until she called trying to cancel the appointment the day of the appointment. She asking the we waive the fee for this one time for her. I told her that it would be up to the provider but per our attendance policy, she would be responsible in full for the bill. I told her I would update her once I heard back from provider.

## 2016-01-17 NOTE — Telephone Encounter (Signed)
Patient notified

## 2016-01-17 NOTE — Telephone Encounter (Signed)
For your records

## 2016-01-31 ENCOUNTER — Ambulatory Visit (INDEPENDENT_AMBULATORY_CARE_PROVIDER_SITE_OTHER): Payer: 59 | Admitting: Family Medicine

## 2016-01-31 ENCOUNTER — Encounter: Payer: Self-pay | Admitting: Family Medicine

## 2016-01-31 VITALS — BP 144/85 | HR 75 | Temp 98.6°F | Ht 68.0 in | Wt 203.0 lb

## 2016-01-31 DIAGNOSIS — E669 Obesity, unspecified: Secondary | ICD-10-CM | POA: Diagnosis not present

## 2016-01-31 MED ORDER — PHENTERMINE HCL 37.5 MG PO CAPS: 37.5000 mg | ORAL_CAPSULE | ORAL | Status: DC

## 2016-01-31 NOTE — Patient Instructions (Signed)
Obesity Obesity is defined as having too much total body fat and a body mass index (BMI) of 30 or more. BMI is an estimate of body fat and is calculated from your height and weight. BMI is typically calculated by your health care provider during regular wellness visits. Obesity happens when you consume more calories than you can burn by exercising or performing daily physical tasks. Prolonged obesity can cause major illnesses or emergencies, such as:  Stroke.  Heart disease.  Diabetes.  Cancer.  Arthritis.  High blood pressure (hypertension).  High cholesterol.  Sleep apnea.  Erectile dysfunction.  Infertility problems. CAUSES   Regularly eating unhealthy foods.  Physical inactivity.  Certain disorders, such as an underactive thyroid (hypothyroidism), Cushing's syndrome, and polycystic ovarian syndrome.  Certain medicines, such as steroids, some depression medicines, and antipsychotics.  Genetics.  Lack of sleep. DIAGNOSIS A health care provider can diagnose obesity after calculating your BMI. Obesity will be diagnosed if your BMI is 30 or higher. There are other methods of measuring obesity levels. Some other methods include measuring your skinfold thickness, your waist circumference, and comparing your hip circumference to your waist circumference. TREATMENT  A healthy treatment program includes some or all of the following:  Long-term dietary changes.  Exercise and physical activity.  Behavioral and lifestyle changes.  Medicine only under the supervision of your health care provider. Medicines may help, but only if they are used with diet and exercise programs. If your BMI is 40 or higher, your health care provider may recommend specialized surgery or programs to help with weight loss. An unhealthy treatment program includes:  Fasting.  Fad diets.  Supplements and drugs. These choices do not succeed in long-term weight control. HOME CARE  INSTRUCTIONS  Exercise and perform physical activity as directed by your health care provider. To increase physical activity, try the following:  Use stairs instead of elevators.  Park farther away from store entrances.  Garden, bike, or walk instead of watching television or using the computer.  Eat healthy, low-calorie foods and drinks on a regular basis. Eat more fruits and vegetables. Use low-calorie cookbooks or take healthy cooking classes.  Limit fast food, sweets, and processed snack foods.  Eat smaller portions.  Keep a daily journal of everything you eat. There are many free websites to help you with this. It may be helpful to measure your foods so you can determine if you are eating the correct portion sizes.  Avoid drinking alcohol. Drink more water and drinks without calories.  Take vitamins and supplements only as recommended by your health care provider.  Weight-loss support groups, registered dietitians, counselors, and stress reduction education can also be very helpful. SEEK IMMEDIATE MEDICAL CARE IF:  You have chest pain or tightness.  You have trouble breathing or feel short of breath.  You have weakness or leg numbness.  You feel confused or have trouble talking.  You have sudden changes in your vision.   This information is not intended to replace advice given to you by your health care provider. Make sure you discuss any questions you have with your health care provider.   Document Released: 01/22/2005 Document Revised: 01/05/2015 Document Reviewed: 01/21/2012 Elsevier Interactive Patient Education 2016 [employer]  

## 2016-01-31 NOTE — Progress Notes (Signed)
Patient ID: Emily [name], female    DOB: [birth year]  Age: 50 y.o. MRN: [id number]    Subjective:  Subjective HPI Emily [name] presents for f/u weight.  She is exercising and watching her diet.    Review of Systems  Constitutional: Negative for diaphoresis, appetite change, fatigue and unexpected weight change.  Eyes: Negative for pain, redness and visual disturbance.  Respiratory: Negative for cough, chest tightness, shortness of breath and wheezing.   Cardiovascular: Negative for chest pain, palpitations and leg swelling.  Endocrine: Negative for cold intolerance, heat intolerance, polydipsia, polyphagia and polyuria.  Genitourinary: Negative for dysuria, frequency and difficulty urinating.  Neurological: Negative for dizziness, light-headedness, numbness and headaches.    History Past Medical History  Diagnosis Date  . Meningitis spinal 2012  . Chicken pox   . Genital warts   . Colon polyp   . GERD (gastroesophageal reflux disease)     She has past surgical history that includes Cesarean section ('92, '01) and Breast surgery ('84).   Her family history is not on file.She reports that she has never smoked. She does not have any smokeless tobacco history on file. She reports that she drinks alcohol. She reports that she does not use illicit drugs.  Current Outpatient Prescriptions on File Prior to Visit  Medication Sig Dispense Refill  . cholecalciferol (VITAMIN D) 1000 UNITS tablet Take 1,000 Units by mouth daily.    Marland Kitchen glucosamine-chondroitin 500-400 MG tablet Take 1 tablet by mouth 3 (three) times daily.     No current facility-administered medications on file prior to visit.     Objective:  Objective Physical Exam  Constitutional: She is oriented to person, place, and time. She appears well-developed and well-nourished.  HENT:  Head: Normocephalic and atraumatic.  Eyes: Conjunctivae and EOM are normal.  Neck: Normal range of motion. Neck supple. No JVD present.  Carotid bruit is not present. No thyromegaly present.  Cardiovascular: Normal rate, regular rhythm and normal heart sounds.   No murmur heard. Pulmonary/Chest: Effort normal and breath sounds normal. No respiratory distress. She has no wheezes. She has no rales. She exhibits no tenderness.  Musculoskeletal: She exhibits no edema.  Neurological: She is alert and oriented to person, place, and time.  Psychiatric: She has a normal mood and affect.  Nursing note and vitals reviewed.  BP 144/85 mmHg  Pulse 75  Temp(Src) 98.6 F (37 C) (Oral)  Ht 5\' 8"  (1.727 m)  Wt 203 lb (92.08 kg)  BMI 30.87 kg/m2  SpO2 100% Wt Readings from Last 3 Encounters:  01/31/16 203 lb (92.08 kg)  12/20/15 201 lb 3.2 oz (91.264 kg)  11/20/15 206 lb 6.4 oz (93.622 kg)     Lab Results  Component Value Date   CHOL 207* 09/27/2015   TRIG 167.0* 09/27/2015   HDL 48.70 09/27/2015   LDLCALC 124* 09/27/2015   TSH 1.87 09/27/2015    US Transvaginal Non-ob  10/03/2015  CLINICAL DATA:  Pelvic cramping.  Dysmenorrhea. EXAM: TRANSABDOMINAL AND TRANSVAGINAL ULTRASOUND OF PELVIS TECHNIQUE: Both transabdominal and transvaginal ultrasound examinations of the pelvis were performed. Transabdominal technique was performed for global imaging of the pelvis including uterus, ovaries, adnexal regions, and pelvic cul-de-sac. It was necessary to proceed with endovaginal exam following the transabdominal exam to visualize the endometrium. COMPARISON:  None FINDINGS: Uterus Measurements: 10.7 x 5.4 x 6.5 cm. Multiple nabothian cysts. No fibroids or other mass visualized. Endometrium Thickness: 8 mm. 10 mm focal endometrial thickening and a small amount of fluid. Right  ovary Not visualized. Left ovary Measurements: 2.9 x 1.8 x 1.9 cm. Homogeneously hypoechoic, avascular left ovarian mass measuring 2.3 x 1.4 x 1.2 cm which may represent endometrioma or hemorrhagic cyst. Other findings Trace pelvic free fluid likely physiologic. IMPRESSION:  1. Small 10 mm echogenic focal area of endometrial thickening seen transabdominally, but not appreciated transvaginally. Consider further evaluation with sonohysterogram for confirmation. Endometrial sampling should also be considered if patient is at high risk for endometrial carcinoma. (Ref: Radiological Reasoning: Algorithmic Workup of Abnormal Vaginal Bleeding with Endovaginal Sonography and Sonohysterography. AJR 2008; [id number]) 2. Homogeneously hypoechoic, avascular left ovarian mass measuring 2.3 x 1.4 x 1.2 cm which may represent endometrioma or hemorrhagic cyst. Electronically Signed   By: Kathreen Devoid   On: 10/03/2015 11:32   US Pelvis Complete  10/03/2015  CLINICAL DATA:  Pelvic cramping.  Dysmenorrhea. EXAM: TRANSABDOMINAL AND TRANSVAGINAL ULTRASOUND OF PELVIS TECHNIQUE: Both transabdominal and transvaginal ultrasound examinations of the pelvis were performed. Transabdominal technique was performed for global imaging of the pelvis including uterus, ovaries, adnexal regions, and pelvic cul-de-sac. It was necessary to proceed with endovaginal exam following the transabdominal exam to visualize the endometrium. COMPARISON:  None FINDINGS: Uterus Measurements: 10.7 x 5.4 x 6.5 cm. Multiple nabothian cysts. No fibroids or other mass visualized. Endometrium Thickness: 8 mm. 10 mm focal endometrial thickening and a small amount of fluid. Right ovary Not visualized. Left ovary Measurements: 2.9 x 1.8 x 1.9 cm. Homogeneously hypoechoic, avascular left ovarian mass measuring 2.3 x 1.4 x 1.2 cm which may represent endometrioma or hemorrhagic cyst. Other findings Trace pelvic free fluid likely physiologic. IMPRESSION: 1. Small 10 mm echogenic focal area of endometrial thickening seen transabdominally, but not appreciated transvaginally. Consider further evaluation with sonohysterogram for confirmation. Endometrial sampling should also be considered if patient is at high risk for endometrial carcinoma. (Ref:  Radiological Reasoning: Algorithmic Workup of Abnormal Vaginal Bleeding with Endovaginal Sonography and Sonohysterography. AJR 2008; [id number]) 2. Homogeneously hypoechoic, avascular left ovarian mass measuring 2.3 x 1.4 x 1.2 cm which may represent endometrioma or hemorrhagic cyst. Electronically Signed   By: Kathreen Devoid   On: 10/03/2015 11:32     Assessment & Plan:  Plan I am having Ms. Willers maintain her cholecalciferol, glucosamine-chondroitin, and phentermine.  Meds ordered this encounter  Medications  . phentermine 37.5 MG capsule    Sig: Take 1 capsule (37.5 mg total) by mouth every morning.    Dispense:  30 capsule    Refill:  0    Problem List Items Addressed This Visit      Unprioritized   Morbid obesity ([place])    con't diet and exercise Refill phenteramine rto 1 month      Relevant Medications   phentermine 37.5 MG capsule    Other Visit Diagnoses    Obesity    -  Primary    Relevant Medications    phentermine 37.5 MG capsule       Follow-up: Return in about 4 weeks (around 02/28/2016), or if symptoms worsen or fail to improve.  Garnet Koyanagi, DO

## 2016-01-31 NOTE — Progress Notes (Signed)
Pre visit review using our clinic review tool, if applicable. No additional management support is needed unless otherwise documented below in the visit note. 

## 2016-02-01 MED FILL — PHENTERMINE 37.5 MG CAPSULE: 37.5 | 30 days supply | Qty: 30 | Fill #0

## 2016-02-02 NOTE — Assessment & Plan Note (Signed)
con't diet and exercise Refill phenteramine rto 1 month

## 2016-02-29 ENCOUNTER — Ambulatory Visit (INDEPENDENT_AMBULATORY_CARE_PROVIDER_SITE_OTHER): Payer: 59 | Admitting: Family Medicine

## 2016-02-29 ENCOUNTER — Encounter: Payer: Self-pay | Admitting: Family Medicine

## 2016-02-29 DIAGNOSIS — Z6830 Body mass index (BMI) 30.0-30.9, adult: Secondary | ICD-10-CM

## 2016-02-29 MED ORDER — PHENTERMINE HCL 37.5 MG PO CAPS: 37.5000 mg | ORAL_CAPSULE | ORAL | Status: DC

## 2016-02-29 MED FILL — PHENTERMINE 37.5 MG CAPSULE: 37.5 | 30 days supply | Qty: 30 | Fill #0

## 2016-02-29 NOTE — Progress Notes (Signed)
Pre visit review using our clinic review tool, if applicable. No additional management support is needed unless otherwise documented below in the visit note. 

## 2016-02-29 NOTE — Progress Notes (Deleted)
   Subjective:    Patient ID: Emily [name], female    DOB: [birth year], 50 y.o.   MRN: [id number]  HPI    Review of Systems     Objective:   Physical Exam        Assessment & Plan:

## 2016-02-29 NOTE — Patient Instructions (Signed)

## 2016-03-03 NOTE — Progress Notes (Signed)
Patient ID: Emily [name], female    DOB: [birth year]  Age: 50 y.o. MRN: [id number]    Subjective:  Subjective HPI Emily [name] presents for f/u weight management.  She has had a rough month and would like more time..    Review of Systems  Constitutional: Negative for diaphoresis, appetite change, fatigue and unexpected weight change.  Eyes: Negative for pain, redness and visual disturbance.  Respiratory: Negative for cough, chest tightness, shortness of breath and wheezing.   Cardiovascular: Negative for chest pain, palpitations and leg swelling.  Endocrine: Negative for cold intolerance, heat intolerance, polydipsia, polyphagia and polyuria.  Genitourinary: Negative for dysuria, frequency and difficulty urinating.  Neurological: Negative for dizziness, light-headedness, numbness and headaches.    History Past Medical History  Diagnosis Date  . Meningitis spinal 2012  . Chicken pox   . Genital warts   . Colon polyp   . GERD (gastroesophageal reflux disease)     She has past surgical history that includes Cesarean section ('92, '01) and Breast surgery ('84).   Her family history is not on file.She reports that she has never smoked. She does not have any smokeless tobacco history on file. She reports that she drinks alcohol. She reports that she does not use illicit drugs.  Current Outpatient Prescriptions on File Prior to Visit  Medication Sig Dispense Refill  . cholecalciferol (VITAMIN D) 1000 UNITS tablet Take 1,000 Units by mouth daily.    Marland Kitchen glucosamine-chondroitin 500-400 MG tablet Take 1 tablet by mouth 3 (three) times daily.     No current facility-administered medications on file prior to visit.     Objective:  Objective Physical Exam  Constitutional: She is oriented to person, place, and time. She appears well-developed and well-nourished.  HENT:  Head: Normocephalic and atraumatic.  Nose: Mucosal edema, rhinorrhea and sinus tenderness present. No nasal  deformity. Right sinus exhibits maxillary sinus tenderness and frontal sinus tenderness. Left sinus exhibits maxillary sinus tenderness and frontal sinus tenderness.  Mouth/Throat: Oropharynx is clear and moist and mucous membranes are normal. No oropharyngeal exudate.  Eyes: Conjunctivae and EOM are normal.  Neck: Normal range of motion. Neck supple. No JVD present. Carotid bruit is not present. No thyromegaly present.  Cardiovascular: Normal rate, regular rhythm and normal heart sounds.   No murmur heard. Pulmonary/Chest: Effort normal and breath sounds normal. No respiratory distress. She has no wheezes. She has no rales. She exhibits no tenderness.  Musculoskeletal: She exhibits no edema.  Lymphadenopathy:    She has no cervical adenopathy.  Neurological: She is alert and oriented to person, place, and time.  Skin: Skin is warm. She is not diaphoretic.  Psychiatric: She has a normal mood and affect.   BP 130/78 mmHg  Pulse 79  Temp(Src) 97.9 F (36.6 C) (Oral)  Ht 5\' 8"  (1.727 m)  Wt 202 lb (91.627 kg)  BMI 30.72 kg/m2  SpO2 98% Wt Readings from Last 3 Encounters:  02/29/16 202 lb (91.627 kg)  01/31/16 203 lb (92.08 kg)  12/20/15 201 lb 3.2 oz (91.264 kg)     Lab Results  Component Value Date   CHOL 207* 09/27/2015   TRIG 167.0* 09/27/2015   HDL 48.70 09/27/2015   LDLCALC 124* 09/27/2015   TSH 1.87 09/27/2015    US Transvaginal Non-ob  10/03/2015  CLINICAL DATA:  Pelvic cramping.  Dysmenorrhea. EXAM: TRANSABDOMINAL AND TRANSVAGINAL ULTRASOUND OF PELVIS TECHNIQUE: Both transabdominal and transvaginal ultrasound examinations of the pelvis were performed. Transabdominal technique was performed for global imaging  of the pelvis including uterus, ovaries, adnexal regions, and pelvic cul-de-sac. It was necessary to proceed with endovaginal exam following the transabdominal exam to visualize the endometrium. COMPARISON:  None FINDINGS: Uterus Measurements: 10.7 x 5.4 x 6.5 cm.  Multiple nabothian cysts. No fibroids or other mass visualized. Endometrium Thickness: 8 mm. 10 mm focal endometrial thickening and a small amount of fluid. Right ovary Not visualized. Left ovary Measurements: 2.9 x 1.8 x 1.9 cm. Homogeneously hypoechoic, avascular left ovarian mass measuring 2.3 x 1.4 x 1.2 cm which may represent endometrioma or hemorrhagic cyst. Other findings Trace pelvic free fluid likely physiologic. IMPRESSION: 1. Small 10 mm echogenic focal area of endometrial thickening seen transabdominally, but not appreciated transvaginally. Consider further evaluation with sonohysterogram for confirmation. Endometrial sampling should also be considered if patient is at high risk for endometrial carcinoma. (Ref: Radiological Reasoning: Algorithmic Workup of Abnormal Vaginal Bleeding with Endovaginal Sonography and Sonohysterography. AJR 2008; [id number]) 2. Homogeneously hypoechoic, avascular left ovarian mass measuring 2.3 x 1.4 x 1.2 cm which may represent endometrioma or hemorrhagic cyst. Electronically Signed   By: Kathreen Devoid   On: 10/03/2015 11:32   US Pelvis Complete  10/03/2015  CLINICAL DATA:  Pelvic cramping.  Dysmenorrhea. EXAM: TRANSABDOMINAL AND TRANSVAGINAL ULTRASOUND OF PELVIS TECHNIQUE: Both transabdominal and transvaginal ultrasound examinations of the pelvis were performed. Transabdominal technique was performed for global imaging of the pelvis including uterus, ovaries, adnexal regions, and pelvic cul-de-sac. It was necessary to proceed with endovaginal exam following the transabdominal exam to visualize the endometrium. COMPARISON:  None FINDINGS: Uterus Measurements: 10.7 x 5.4 x 6.5 cm. Multiple nabothian cysts. No fibroids or other mass visualized. Endometrium Thickness: 8 mm. 10 mm focal endometrial thickening and a small amount of fluid. Right ovary Not visualized. Left ovary Measurements: 2.9 x 1.8 x 1.9 cm. Homogeneously hypoechoic, avascular left ovarian mass measuring 2.3  x 1.4 x 1.2 cm which may represent endometrioma or hemorrhagic cyst. Other findings Trace pelvic free fluid likely physiologic. IMPRESSION: 1. Small 10 mm echogenic focal area of endometrial thickening seen transabdominally, but not appreciated transvaginally. Consider further evaluation with sonohysterogram for confirmation. Endometrial sampling should also be considered if patient is at high risk for endometrial carcinoma. (Ref: Radiological Reasoning: Algorithmic Workup of Abnormal Vaginal Bleeding with Endovaginal Sonography and Sonohysterography. AJR 2008; [id number]) 2. Homogeneously hypoechoic, avascular left ovarian mass measuring 2.3 x 1.4 x 1.2 cm which may represent endometrioma or hemorrhagic cyst. Electronically Signed   By: Kathreen Devoid   On: 10/03/2015 11:32     Assessment & Plan:  Plan I am having Emily [name] maintain her cholecalciferol, glucosamine-chondroitin, and phentermine.  Meds ordered this encounter  Medications  . phentermine 37.5 MG capsule    Sig: Take 1 capsule (37.5 mg total) by mouth every morning.    Dispense:  30 capsule    Refill:  0    Problem List Items Addressed This Visit    Morbid obesity ([place]) - Primary   Relevant Medications   phentermine 37.5 MG capsule    con't diet and exercise  Follow-up: Return in about 4 weeks (around 03/28/2016), or if symptoms worsen or fail to improve.  Garnet Koyanagi, DO

## 2016-03-31 ENCOUNTER — Telehealth: Payer: Self-pay | Admitting: Family Medicine

## 2016-03-31 ENCOUNTER — Ambulatory Visit: Payer: 59 | Admitting: Family Medicine

## 2016-04-02 NOTE — Telephone Encounter (Signed)
Pt was no show 03/31/16 9:15am for follow up appt, pt has not rescheduled, 2nd no show w/in 12 months, multiple cancellations, charge or no charge?

## 2016-04-03 ENCOUNTER — Encounter: Payer: Self-pay | Admitting: Family Medicine

## 2016-04-03 NOTE — Telephone Encounter (Signed)
charge 

## 2016-04-03 NOTE — Telephone Encounter (Signed)
Marked to charge and mailing no show letter °

## 2016-04-10 ENCOUNTER — Ambulatory Visit (INDEPENDENT_AMBULATORY_CARE_PROVIDER_SITE_OTHER): Payer: 59 | Admitting: Family Medicine

## 2016-04-10 ENCOUNTER — Encounter: Payer: Self-pay | Admitting: Family Medicine

## 2016-04-10 MED ORDER — LORCASERIN HCL 10 MG PO TABS: ORAL_TABLET | ORAL | Status: DC

## 2016-04-10 MED FILL — BELVIQ 10 MG TABLET: 10 | 30 days supply | Qty: 60 | Fill #0

## 2016-04-10 NOTE — Progress Notes (Signed)
Pre visit review using our clinic review tool, if applicable. No additional management support is needed unless otherwise documented below in the visit note. 

## 2016-04-10 NOTE — Patient Instructions (Signed)

## 2016-04-10 NOTE — Progress Notes (Signed)
+Patient ID: Tollie Eth, female    DOB: 11-16-1966  Age: 50 y.o. MRN: [id number]    Subjective:  Subjective HPI Emily [name] presents for weight check.  Pt just got back from a fun weekend and was happy she lost a lb.    Review of Systems  Constitutional: Negative for diaphoresis, appetite change, fatigue and unexpected weight change.  Eyes: Negative for pain, redness and visual disturbance.  Respiratory: Negative for cough, chest tightness, shortness of breath and wheezing.   Cardiovascular: Negative for chest pain, palpitations and leg swelling.  Endocrine: Negative for cold intolerance, heat intolerance, polydipsia, polyphagia and polyuria.  Genitourinary: Negative for dysuria, frequency and difficulty urinating.  Neurological: Negative for dizziness, light-headedness, numbness and headaches.    History Past Medical History  Diagnosis Date  . Meningitis spinal 2012  . Chicken pox   . Genital warts   . Colon polyp   . GERD (gastroesophageal reflux disease)     She has past surgical history that includes Cesarean section ('92, '01) and Breast surgery ('84).   Her family history is not on file.She reports that she has never smoked. She does not have any smokeless tobacco history on file. She reports that she drinks alcohol. She reports that she does not use illicit drugs.  Current Outpatient Prescriptions on File Prior to Visit  Medication Sig Dispense Refill  . cholecalciferol (VITAMIN D) 1000 UNITS tablet Take 1,000 Units by mouth daily.    Marland Kitchen glucosamine-chondroitin 500-400 MG tablet Take 1 tablet by mouth 3 (three) times daily.     No current facility-administered medications on file prior to visit.     Objective:  Objective Physical Exam  Constitutional: She is oriented to person, place, and time. She appears well-developed and well-nourished.  HENT:  Head: Normocephalic and atraumatic.  Eyes: Conjunctivae and EOM are normal.  Neck: Normal range of motion.  Neck supple. No JVD present. Carotid bruit is not present. No thyromegaly present.  Cardiovascular: Normal rate, regular rhythm and normal heart sounds.   No murmur heard. Pulmonary/Chest: Effort normal and breath sounds normal. No respiratory distress. She has no wheezes. She has no rales. She exhibits no tenderness.  Musculoskeletal: She exhibits no edema.  Neurological: She is alert and oriented to person, place, and time.  Psychiatric: She has a normal mood and affect. Her behavior is normal.  Nursing note and vitals reviewed.  BP 132/76 mmHg  Pulse 78  Temp(Src) 98.5 F (36.9 C) (Oral)  Ht 5\' 8"  (1.727 m)  Wt 201 lb (91.173 kg)  BMI 30.57 kg/m2  SpO2 98% Wt Readings from Last 3 Encounters:  04/10/16 201 lb (91.173 kg)  02/29/16 202 lb (91.627 kg)  01/31/16 203 lb (92.08 kg)     Lab Results  Component Value Date   CHOL 207* 09/27/2015   TRIG 167.0* 09/27/2015   HDL 48.70 09/27/2015   LDLCALC 124* 09/27/2015   TSH 1.87 09/27/2015    US Transvaginal Non-ob  10/03/2015  CLINICAL DATA:  Pelvic cramping.  Dysmenorrhea. EXAM: TRANSABDOMINAL AND TRANSVAGINAL ULTRASOUND OF PELVIS TECHNIQUE: Both transabdominal and transvaginal ultrasound examinations of the pelvis were performed. Transabdominal technique was performed for global imaging of the pelvis including uterus, ovaries, adnexal regions, and pelvic cul-de-sac. It was necessary to proceed with endovaginal exam following the transabdominal exam to visualize the endometrium. COMPARISON:  None FINDINGS: Uterus Measurements: 10.7 x 5.4 x 6.5 cm. Multiple nabothian cysts. No fibroids or other mass visualized. Endometrium Thickness: 8 mm. 10 mm focal  endometrial thickening and a small amount of fluid. Right ovary Not visualized. Left ovary Measurements: 2.9 x 1.8 x 1.9 cm. Homogeneously hypoechoic, avascular left ovarian mass measuring 2.3 x 1.4 x 1.2 cm which may represent endometrioma or hemorrhagic cyst. Other findings Trace pelvic  free fluid likely physiologic. IMPRESSION: 1. Small 10 mm echogenic focal area of endometrial thickening seen transabdominally, but not appreciated transvaginally. Consider further evaluation with sonohysterogram for confirmation. Endometrial sampling should also be considered if patient is at high risk for endometrial carcinoma. (Ref: Radiological Reasoning: Algorithmic Workup of Abnormal Vaginal Bleeding with Endovaginal Sonography and Sonohysterography. AJR 2008; [id number]) 2. Homogeneously hypoechoic, avascular left ovarian mass measuring 2.3 x 1.4 x 1.2 cm which may represent endometrioma or hemorrhagic cyst. Electronically Signed   By: Kathreen Devoid   On: 10/03/2015 11:32   US Pelvis Complete  10/03/2015  CLINICAL DATA:  Pelvic cramping.  Dysmenorrhea. EXAM: TRANSABDOMINAL AND TRANSVAGINAL ULTRASOUND OF PELVIS TECHNIQUE: Both transabdominal and transvaginal ultrasound examinations of the pelvis were performed. Transabdominal technique was performed for global imaging of the pelvis including uterus, ovaries, adnexal regions, and pelvic cul-de-sac. It was necessary to proceed with endovaginal exam following the transabdominal exam to visualize the endometrium. COMPARISON:  None FINDINGS: Uterus Measurements: 10.7 x 5.4 x 6.5 cm. Multiple nabothian cysts. No fibroids or other mass visualized. Endometrium Thickness: 8 mm. 10 mm focal endometrial thickening and a small amount of fluid. Right ovary Not visualized. Left ovary Measurements: 2.9 x 1.8 x 1.9 cm. Homogeneously hypoechoic, avascular left ovarian mass measuring 2.3 x 1.4 x 1.2 cm which may represent endometrioma or hemorrhagic cyst. Other findings Trace pelvic free fluid likely physiologic. IMPRESSION: 1. Small 10 mm echogenic focal area of endometrial thickening seen transabdominally, but not appreciated transvaginally. Consider further evaluation with sonohysterogram for confirmation. Endometrial sampling should also be considered if patient is at  high risk for endometrial carcinoma. (Ref: Radiological Reasoning: Algorithmic Workup of Abnormal Vaginal Bleeding with Endovaginal Sonography and Sonohysterography. AJR 2008; [id number]) 2. Homogeneously hypoechoic, avascular left ovarian mass measuring 2.3 x 1.4 x 1.2 cm which may represent endometrioma or hemorrhagic cyst. Electronically Signed   By: Kathreen Devoid   On: 10/03/2015 11:32     Assessment & Plan:  Plan I have discontinued Ms. Alligood's phentermine. I am also having her start on Lorcaserin HCl. Additionally, I am having her maintain her cholecalciferol and glucosamine-chondroitin.  Meds ordered this encounter  Medications  . Lorcaserin HCl (BELVIQ) 10 MG TABS    Sig: 1 po bid    Dispense:  60 tablet    Refill:  2    Problem List Items Addressed This Visit    None    Visit Diagnoses    Morbid obesity due to excess calories ([place])    -  Primary    Relevant Medications    Lorcaserin HCl (BELVIQ) 10 MG TABS     cont diet and exercise If pt can keep weight off for 3 months we can consider phenteramine again  Follow-up: Return in about 3 months (around 07/10/2016), or if symptoms worsen or fail to improve, for weight check.  Ann Held, DO

## 2016-07-10 ENCOUNTER — Ambulatory Visit: Payer: 59 | Admitting: Family Medicine

## 2016-07-14 ENCOUNTER — Ambulatory Visit (INDEPENDENT_AMBULATORY_CARE_PROVIDER_SITE_OTHER): Payer: 59 | Admitting: Family Medicine

## 2016-07-14 ENCOUNTER — Ambulatory Visit (HOSPITAL_BASED_OUTPATIENT_CLINIC_OR_DEPARTMENT_OTHER)
Admission: RE | Admit: 2016-07-14 | Discharge: 2016-07-14 | Disposition: A | Discharge status: Home / Self Care | Payer: 59 | Source: Ambulatory Visit | Attending: Family Medicine | Admitting: Family Medicine

## 2016-07-14 ENCOUNTER — Encounter: Payer: Self-pay | Admitting: Family Medicine

## 2016-07-14 DIAGNOSIS — Z780 Asymptomatic menopausal state: Secondary | ICD-10-CM | POA: Insufficient documentation

## 2016-07-14 DIAGNOSIS — T24101A Burn of first degree of unspecified site of right lower limb, except ankle and foot, initial encounter: Secondary | ICD-10-CM

## 2016-07-14 DIAGNOSIS — R29890 Loss of height: Secondary | ICD-10-CM | POA: Insufficient documentation

## 2016-07-14 MED ORDER — PHENTERMINE HCL 37.5 MG PO CAPS: 37.5000 mg | ORAL_CAPSULE | ORAL | Status: DC

## 2016-07-14 MED ORDER — SILVER SULFADIAZINE 1 % EX CREA: 1.0000 "application " | TOPICAL_CREAM | Freq: Every day | CUTANEOUS | Status: DC

## 2016-07-14 MED FILL — PHENTERMINE 37.5 MG TABLET: 37.5 | 30 days supply | Qty: 30 | Fill #0

## 2016-07-14 MED FILL — SSD 1% CREAM: 1 | 30 days supply | Qty: 50 | Fill #0

## 2016-07-14 NOTE — Patient Instructions (Signed)
Obesity Obesity is defined as having too much total body fat and a body mass index (BMI) of 30 or more. BMI is an estimate of body fat and is calculated from your height and weight. BMI is typically calculated by your health care provider during regular wellness visits. Obesity happens when you consume more calories than you can burn by exercising or performing daily physical tasks. Prolonged obesity can cause major illnesses or emergencies, such as:  Stroke.  Heart disease.  Diabetes.  Cancer.  Arthritis.  High blood pressure (hypertension).  High cholesterol.  Sleep apnea.  Erectile dysfunction.  Infertility problems. CAUSES   Regularly eating unhealthy foods.  Physical inactivity.  Certain disorders, such as an underactive thyroid (hypothyroidism), Cushing's syndrome, and polycystic ovarian syndrome.  Certain medicines, such as steroids, some depression medicines, and antipsychotics.  Genetics.  Lack of sleep. DIAGNOSIS A health care provider can diagnose obesity after calculating your BMI. Obesity will be diagnosed if your BMI is 30 or higher. There are other methods of measuring obesity levels. Some other methods include measuring your skinfold thickness, your waist circumference, and comparing your hip circumference to your waist circumference. TREATMENT  A healthy treatment program includes some or all of the following:  Long-term dietary changes.  Exercise and physical activity.  Behavioral and lifestyle changes.  Medicine only under the supervision of your health care provider. Medicines may help, but only if they are used with diet and exercise programs. If your BMI is 40 or higher, your health care provider may recommend specialized surgery or programs to help with weight loss. An unhealthy treatment program includes:  Fasting.  Fad diets.  Supplements and drugs. These choices do not succeed in long-term weight control. HOME CARE  INSTRUCTIONS  Exercise and perform physical activity as directed by your health care provider. To increase physical activity, try the following:  Use stairs instead of elevators.  Park farther away from store entrances.  Garden, bike, or walk instead of watching television or using the computer.  Eat healthy, low-calorie foods and drinks on a regular basis. Eat more fruits and vegetables. Use low-calorie cookbooks or take healthy cooking classes.  Limit fast food, sweets, and processed snack foods.  Eat smaller portions.  Keep a daily journal of everything you eat. There are many free websites to help you with this. It may be helpful to measure your foods so you can determine if you are eating the correct portion sizes.  Avoid drinking alcohol. Drink more water and drinks without calories.  Take vitamins and supplements only as recommended by your health care provider.  Weight-loss support groups, registered dietitians, counselors, and stress reduction education can also be very helpful. SEEK IMMEDIATE MEDICAL CARE IF:  You have chest pain or tightness.  You have trouble breathing or feel short of breath.  You have weakness or leg numbness.  You feel confused or have trouble talking.  You have sudden changes in your vision.   This information is not intended to replace advice given to you by your health care provider. Make sure you discuss any questions you have with your health care provider.   Document Released: 01/22/2005 Document Revised: 01/05/2015 Document Reviewed: 01/21/2012 Elsevier Interactive Patient Education 2016 [employer]  

## 2016-07-14 NOTE — Progress Notes (Signed)
Patient ID: Emily [name], female    DOB: Mar 29, 1966  Age: 50 y.o. MRN: [id number]    Subjective:  Subjective HPI Emily [name] presents for f/u weight.  She has gained weight off phenteramine.    Review of Systems  Constitutional: Negative for fever, chills, activity change and appetite change.  Gastrointestinal: Negative for abdominal pain and abdominal distention.  Genitourinary: Positive for dysuria, urgency and frequency. Negative for hematuria, flank pain, vaginal discharge, difficulty urinating, genital sores, vaginal pain, menstrual problem, pelvic pain and dyspareunia.  Musculoskeletal: Negative for back pain.    History Past Medical History  Diagnosis Date  . Meningitis spinal 2012  . Chicken pox   . Genital warts   . Colon polyp   . GERD (gastroesophageal reflux disease)     She has past surgical history that includes Cesarean section ('92, '01) and Breast surgery ('84).   Her family history is not on file.She reports that she has never smoked. She does not have any smokeless tobacco history on file. She reports that she drinks alcohol. She reports that she does not use illicit drugs.  Current Outpatient Prescriptions on File Prior to Visit  Medication Sig Dispense Refill  . glucosamine-chondroitin 500-400 MG tablet Take 1 tablet by mouth 3 (three) times daily. Reported on 07/14/2016    . cholecalciferol (VITAMIN D) 1000 UNITS tablet Take 1,000 Units by mouth daily. Reported on 07/14/2016    . Lorcaserin HCl (BELVIQ) 10 MG TABS 1 po bid (Patient not taking: Reported on 07/14/2016) 60 tablet 2   No current facility-administered medications on file prior to visit.     Objective:  Objective Physical Exam  Constitutional: She is oriented to person, place, and time. She appears well-developed and well-nourished.  HENT:  Head: Normocephalic and atraumatic.  Eyes: Conjunctivae and EOM are normal.  Neck: Normal range of motion. Neck supple. No JVD present. Carotid bruit  is not present. No thyromegaly present.  Cardiovascular: Normal rate, regular rhythm and normal heart sounds.   No murmur heard. Pulmonary/Chest: Effort normal and breath sounds normal. No respiratory distress. She has no wheezes. She has no rales. She exhibits no tenderness.  Musculoskeletal: She exhibits no edema.  Neurological: She is alert and oriented to person, place, and time.  Skin:     Psychiatric: She has a normal mood and affect. Her behavior is normal. Judgment and thought content normal.  Nursing note and vitals reviewed.  BP 133/84 mmHg  Pulse 73  Temp(Src) 98.7 F (37.1 C) (Oral)  Ht 5\' 8"  (1.727 m)  Wt 214 lb (97.07 kg)  BMI 32.55 kg/m2  SpO2 100% Wt Readings from Last 3 Encounters:  07/14/16 214 lb (97.07 kg)  04/10/16 201 lb (91.173 kg)  02/29/16 202 lb (91.627 kg)     Lab Results  Component Value Date   CHOL 207* 09/27/2015   TRIG 167.0* 09/27/2015   HDL 48.70 09/27/2015   LDLCALC 124* 09/27/2015   TSH 1.87 09/27/2015    US Transvaginal Non-ob  10/03/2015  CLINICAL DATA:  Pelvic cramping.  Dysmenorrhea. EXAM: TRANSABDOMINAL AND TRANSVAGINAL ULTRASOUND OF PELVIS TECHNIQUE: Both transabdominal and transvaginal ultrasound examinations of the pelvis were performed. Transabdominal technique was performed for global imaging of the pelvis including uterus, ovaries, adnexal regions, and pelvic cul-de-sac. It was necessary to proceed with endovaginal exam following the transabdominal exam to visualize the endometrium. COMPARISON:  None FINDINGS: Uterus Measurements: 10.7 x 5.4 x 6.5 cm. Multiple nabothian cysts. No fibroids or other mass visualized. Endometrium Thickness:  8 mm. 10 mm focal endometrial thickening and a small amount of fluid. Right ovary Not visualized. Left ovary Measurements: 2.9 x 1.8 x 1.9 cm. Homogeneously hypoechoic, avascular left ovarian mass measuring 2.3 x 1.4 x 1.2 cm which may represent endometrioma or hemorrhagic cyst. Other findings Trace  pelvic free fluid likely physiologic. IMPRESSION: 1. Small 10 mm echogenic focal area of endometrial thickening seen transabdominally, but not appreciated transvaginally. Consider further evaluation with sonohysterogram for confirmation. Endometrial sampling should also be considered if patient is at high risk for endometrial carcinoma. (Ref: Radiological Reasoning: Algorithmic Workup of Abnormal Vaginal Bleeding with Endovaginal Sonography and Sonohysterography. AJR 2008; [id number]) 2. Homogeneously hypoechoic, avascular left ovarian mass measuring 2.3 x 1.4 x 1.2 cm which may represent endometrioma or hemorrhagic cyst. Electronically Signed   By: Kathreen Devoid   On: 10/03/2015 11:32   US Pelvis Complete  10/03/2015  CLINICAL DATA:  Pelvic cramping.  Dysmenorrhea. EXAM: TRANSABDOMINAL AND TRANSVAGINAL ULTRASOUND OF PELVIS TECHNIQUE: Both transabdominal and transvaginal ultrasound examinations of the pelvis were performed. Transabdominal technique was performed for global imaging of the pelvis including uterus, ovaries, adnexal regions, and pelvic cul-de-sac. It was necessary to proceed with endovaginal exam following the transabdominal exam to visualize the endometrium. COMPARISON:  None FINDINGS: Uterus Measurements: 10.7 x 5.4 x 6.5 cm. Multiple nabothian cysts. No fibroids or other mass visualized. Endometrium Thickness: 8 mm. 10 mm focal endometrial thickening and a small amount of fluid. Right ovary Not visualized. Left ovary Measurements: 2.9 x 1.8 x 1.9 cm. Homogeneously hypoechoic, avascular left ovarian mass measuring 2.3 x 1.4 x 1.2 cm which may represent endometrioma or hemorrhagic cyst. Other findings Trace pelvic free fluid likely physiologic. IMPRESSION: 1. Small 10 mm echogenic focal area of endometrial thickening seen transabdominally, but not appreciated transvaginally. Consider further evaluation with sonohysterogram for confirmation. Endometrial sampling should also be considered if patient is  at high risk for endometrial carcinoma. (Ref: Radiological Reasoning: Algorithmic Workup of Abnormal Vaginal Bleeding with Endovaginal Sonography and Sonohysterography. AJR 2008; [id number]) 2. Homogeneously hypoechoic, avascular left ovarian mass measuring 2.3 x 1.4 x 1.2 cm which may represent endometrioma or hemorrhagic cyst. Electronically Signed   By: Kathreen Devoid   On: 10/03/2015 11:32     Assessment & Plan:  Plan I am having Ms. Cranshaw start on silver sulfADIAZINE and phentermine. I am also having her maintain her cholecalciferol, glucosamine-chondroitin, Lorcaserin HCl, and multivitamin.  Meds ordered this encounter  Medications  . Multiple Vitamin (MULTIVITAMIN) tablet    Sig: Take 1 tablet by mouth daily.  . silver sulfADIAZINE (SILVADENE) 1 % cream    Sig: Apply 1 application topically daily.    Dispense:  50 g    Refill:  0  . phentermine 37.5 MG capsule    Sig: Take 1 capsule (37.5 mg total) by mouth every morning.    Dispense:  30 capsule    Refill:  0    Problem List Items Addressed This Visit    None    Visit Diagnoses    Morbid obesity due to excess calories ([place])    -  Primary    Relevant Medications    phentermine 37.5 MG capsule    Other Relevant Orders    Amb ref to Medical Nutrition Therapy-MNT    Burn of leg, first degree, right, initial encounter        Relevant Medications    silver sulfADIAZINE (SILVADENE) 1 % cream    Loss of height  Relevant Orders    DG Bone Density (Completed)       Follow-up: Return in about 4 weeks (around 08/11/2016), or if symptoms worsen or fail to improve.  Ann Held, DO

## 2016-07-25 ENCOUNTER — Ambulatory Visit (INDEPENDENT_AMBULATORY_CARE_PROVIDER_SITE_OTHER): Payer: 59 | Admitting: Family Medicine

## 2016-07-25 VITALS — BP 122/90 | HR 99 | Temp 98.3°F | Ht 68.0 in | Wt 216.2 lb

## 2016-07-25 DIAGNOSIS — R233 Spontaneous ecchymoses: Secondary | ICD-10-CM | POA: Diagnosis not present

## 2016-07-25 LAB — CBC WITH DIFFERENTIAL/PLATELET
BASOS PCT: 0 %
Basophils Absolute: 0 cells/uL (ref 0–200)
EOS ABS: 64 {cells}/uL (ref 15–500)
Eosinophils Relative: 1 %
HEMATOCRIT: 42.3 % (ref 35.0–45.0)
HEMOGLOBIN: 13.8 g/dL (ref 11.7–15.5)
LYMPHS ABS: 1856 {cells}/uL (ref 850–3900)
Lymphocytes Relative: 29 %
MCH: 28.2 pg (ref 27.0–33.0)
MCHC: 32.6 g/dL (ref 32.0–36.0)
MCV: 86.5 fL (ref 80.0–100.0)
MONO ABS: 384 {cells}/uL (ref 200–950)
MPV: 8.9 fL (ref 7.5–12.5)
Monocytes Relative: 6 %
NEUTROS ABS: 4096 {cells}/uL (ref 1500–7800)
Neutrophils Relative %: 64 %
Platelets: 275 10*3/uL (ref 140–400)
RBC: 4.89 MIL/uL (ref 3.80–5.10)
RDW: 13.6 % (ref 11.0–15.0)
WBC: 6.4 10*3/uL (ref 3.8–10.8)

## 2016-07-25 LAB — POC URINALSYSI DIPSTICK (AUTOMATED)
BILIRUBIN UA: NEGATIVE
GLUCOSE UA: NEGATIVE
Ketones, UA: NEGATIVE
Leukocytes, UA: NEGATIVE
Nitrite, UA: NEGATIVE
PH UA: 7.5
Protein, UA: NEGATIVE
RBC UA: NEGATIVE
SPEC GRAV UA: 1.015
UROBILINOGEN UA: 0.2

## 2016-07-25 NOTE — Patient Instructions (Signed)
You have petechiae on your lower extremities and these can come from many causes We will obtain some screening labs today. Frequently, biopsy of the skin is involved in diagnosis I recommend prompt follow-up with Dr. Etter Sjogren and sooner for any fever, shortness of breath, progressive rash, or new symptoms

## 2016-07-26 LAB — COMPREHENSIVE METABOLIC PANEL
ALK PHOS: 70 U/L (ref 33–130)
ALT: 14 U/L (ref 6–29)
AST: 18 U/L (ref 10–35)
Albumin: 4.4 g/dL (ref 3.6–5.1)
BILIRUBIN TOTAL: 0.3 mg/dL (ref 0.2–1.2)
BUN: 22 mg/dL (ref 7–25)
CALCIUM: 9.7 mg/dL (ref 8.6–10.4)
CO2: 30 mmol/L (ref 20–31)
Chloride: 101 mmol/L (ref 98–110)
Creat: 0.76 mg/dL (ref 0.50–1.05)
GLUCOSE: 87 mg/dL (ref 65–99)
Potassium: 4.4 mmol/L (ref 3.5–5.3)
Sodium: 140 mmol/L (ref 135–146)
TOTAL PROTEIN: 7 g/dL (ref 6.1–8.1)

## 2016-07-26 LAB — SEDIMENTATION RATE: SED RATE: 7 mm/h (ref 0–30)

## 2016-07-27 NOTE — Progress Notes (Signed)
Subjective:     Patient ID: Emily [name], female   DOB: 1966-06-21, 50 y.o.   MRN: [id number]  HPI Patient seen with rash on both lower legs.  Initial onset over 2 weeks ago.  She recently was at beach but onset prior to then and denies any sunburn or major leg edema.  Rash is nonpainful and nonpruritic and nonblanching.  No rash on trunk or upper extremities. No prior hx of similar rash  Denies any headache, fever, arthralgia, easy bleeding,fatigue, myalgia, abdominal pain, diarrhea, dysuria, tick bites,dyspnea, chest pain, night sweats, appetite or weight changes.  Rash was fairly prominent 2 weeks ago and she went to [facility] and she states they had no idea what type of rash she had.  Rash had started to fade earlier this week and now becoming more prominent again.  Couple of areas appear to be confluent now.  No recent new meds-except phentermine which she has not yet started..  Past Medical History:  Diagnosis Date  . Chicken pox   . Colon polyp   . Genital warts   . GERD (gastroesophageal reflux disease)   . Meningitis spinal 2012   Past Surgical History:  Procedure Laterality Date  . BREAST SURGERY  '84  . CESAREAN SECTION  '92, '01   x's 2    reports that she has never smoked. She does not have any smokeless tobacco history on file. She reports that she drinks alcohol. She reports that she does not use drugs. family history is not on file. Allergies  Allergen Reactions  . Other Other (See Comments)    Red meat- abdominal pain     Review of Systems  Constitutional: Negative for chills, fatigue, fever and unexpected weight change.  HENT: Negative for sinus pressure.   Respiratory: Negative for cough and shortness of breath.   Cardiovascular: Negative for chest pain.  Gastrointestinal: Negative for abdominal pain, blood in stool, diarrhea, nausea and vomiting.  Genitourinary: Negative for dysuria and hematuria.  Musculoskeletal: Negative for arthralgias.  Skin:  Positive for rash.  Neurological: Negative for dizziness, weakness and headaches.  Hematological: Negative for adenopathy. Does not bruise/bleed easily.       Objective:   Physical Exam  Constitutional: She is oriented to person, place, and time. She appears well-developed and well-nourished.  HENT:  Right Ear: External ear normal.  Left Ear: External ear normal.  Mouth/Throat: Oropharynx is clear and moist.  Neck: Neck supple.  Cardiovascular: Normal rate and regular rhythm.   No murmur heard. Pulmonary/Chest: Effort normal and breath sounds normal. No respiratory distress. She has no wheezes. She has no rales.  Abdominal: Soft. There is no tenderness.  No splenomegaly or hepatomegaly.  Musculoskeletal: She exhibits no edema.  Lymphadenopathy:    She has no cervical adenopathy.  Neurological: She is alert and oriented to person, place, and time. No cranial nerve deficit.  Skin: Rash noted.  She has several non-blanching small petechiae on both legs.  In a couple of areas these appear to be more hemorrhagic.  No palpable purpura.       Assessment:     Petechiae of both legs in a patient who is non-toxic and with really no other symptoms or significant exam findings.  No new medications and no obvious signs or symptoms of infection.  No arthralgia or other symptoms suggestive of connective tissue/rheumatologic disorder    Plan:     -check further labs with UA, CMP, CBC, sed rate. -Avoid aspirin -we explained she  may need skin biopsy to further assess -Prompt follow up next week with primary and sooner for any fever, progressive rash, or new symptoms.  Eulas Post MD [facility] at [facility]

## 2016-07-28 ENCOUNTER — Telehealth: Payer: Self-pay | Admitting: Family Medicine

## 2016-07-28 ENCOUNTER — Encounter: Payer: Self-pay | Admitting: Family Medicine

## 2016-07-28 DIAGNOSIS — R233 Spontaneous ecchymoses: Secondary | ICD-10-CM

## 2016-07-28 NOTE — Telephone Encounter (Signed)
Relation to [id number] Call back number:[phone]   Reason for call:  Patient was seen by Dr. Eulas Post, MD Friday, July 28th and doctor advised patient to follow up with PCP today to have a BX. Patient states symptoms have not improved please reference 07/25/16 office notes.  Please advise

## 2016-07-28 NOTE — Telephone Encounter (Signed)
She needs appointment for punch biopsy

## 2016-07-28 NOTE — Telephone Encounter (Signed)
Please review and advise     KP 

## 2016-07-28 NOTE — Telephone Encounter (Signed)
Pt states her sores are worst and would like to see burchette or lowne  for biopsy. Pt is aware waiting on dr lowne response first

## 2016-07-29 NOTE — Telephone Encounter (Signed)
Wanted you to see this

## 2016-07-29 NOTE — Telephone Encounter (Signed)
Pt returned call to check on status of below. Informed pt that Dr. Carollee Herter does not have any openings for procedure until Thursday. Pt declined this appt stating 'that is unacceptable. I am having this done today.' Attempted to schedule w/ another provider in the office, but the only provider w/ a 30 min appt could not do the procedure today either. Dr. Elease Hashimoto, who initially saw pt for this complaint, also does not have any openings. Pt was very frustrated, as she feels her concern is urgent and must be addressed today. Per Dr. Etter Sjogren, okay to refer to derm and try to get appt today. Pt stated she felt that was ridiculous, but did ultimately agree. However, she stated, 'If I can't get in with them today, I'm going to drop Dr. Etter Sjogren.'   The pt also asked multiple times why Maudie Mercury had not called her back. I explained to pt that we were waiting for response from Dr. Etter Sjogren regarding procedure, and that the response came after hours yesterday, as Dr. Etter Sjogren was in clinic during the day, and that [place] simply had not had a chance to return her call yet today given that she had just gotten into the office when the patient called in. The patient stated she feels that no one here cares about her and that Dr. Etter Sjogren is too busy for her. Attempted to reassure pt that we do want her to get treatment, but that Dr. Etter Sjogren feels it is safe for her to wait until available appt on Thursday. Pt disagrees and states her legs are 'worse.' The sores are bigger, more red around the edges, and blacker in the middle. She still has no fever, rash, or SOB. Urgent referral to dermatology placed. Pt stated not to bother calling for another appt if we can't get her in with derm today because she is going to find another PCP.

## 2016-07-29 NOTE — Telephone Encounter (Signed)
Called pt to offer appt with [facility] today at 9:45am they will fit her in. Pt agreed. Continued scheduled and called pt back with provider info. She was upset to learn appt is with PA and not an MD. I advised pt that routine derm appts are 1-2 months out and that they are fitting her in due to need for skin biopsy. Pt states that she pays the same copay to see an MD as a PA and that this may not be acceptable. I advised that [facility] has a wait until mid September and that [place] is the 2ndary location we refer to. She stated it did not have to be office in [place]. I again explained it is uncommon to get a same day Derm appt. Pt asked who evaluates the biopsy results. I advised her I did not know and that she is welcome to contact [facility] to ask them. She was provided appt info and contact info for [facility]. She stated that this may not work and she would call me back. She stated [place] and I may have to contact someone else for her.

## 2016-09-25 ENCOUNTER — Ambulatory Visit (INDEPENDENT_AMBULATORY_CARE_PROVIDER_SITE_OTHER): Payer: 59 | Admitting: Family Medicine

## 2016-09-25 ENCOUNTER — Encounter: Payer: Self-pay | Admitting: Family Medicine

## 2016-09-25 VITALS — BP 130/78 | HR 95 | Temp 98.2°F | Ht 68.0 in | Wt 232.2 lb

## 2016-09-25 DIAGNOSIS — M7989 Other specified soft tissue disorders: Secondary | ICD-10-CM

## 2016-09-25 DIAGNOSIS — M25562 Pain in left knee: Secondary | ICD-10-CM | POA: Diagnosis not present

## 2016-09-25 MED ORDER — FUROSEMIDE 40 MG PO TABS: 40.0000 mg | ORAL_TABLET | Freq: Every day | ORAL | 0 refills | Status: DC

## 2016-09-25 NOTE — Patient Instructions (Addendum)
Motrin and Tylenol for pain. Can use 600 mg up to 4 times daily as needed for pain  Take scheduled walks. Do what you can tolerate, but try to push yourself!   Call in 3 weeks to schedule an appointment with either myself or Dr. Etter Sjogren.  Remember to keep your legs elevated.  Do not take the medicine prior to doing something where you won't have access to a bathroom.  I do not think you need a rheumatologist at this time.

## 2016-09-25 NOTE — Progress Notes (Signed)
Pre visit review using our clinic review tool, if applicable. No additional management support is needed unless otherwise documented below in the visit note. 

## 2016-09-25 NOTE — Progress Notes (Addendum)
Chief Complaint  Patient presents with  . Leg Swelling    lower leg,and feet-along with (B) knee pain    Subjective: Patient is a 50 y.o. female here for leg swelling.  Just after the Fourth of July, the patient started having lesions on her feet. They progressed and eventually turned into dark purple spots. She had difficulty getting into her primary care physician's office and ended up with a dermatologist. 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 She was placed on steroids, however her lesions started to ulcerate and emanate a foul odor. She again had difficulty following up with her primary care physician, and her dermatologist saw her and placed on antibiotics after obtaining a culture that was positive for MRSA. The lesions are just starting to heal. She is still having issues with them regarding pain. Her lower extremities are still swollen. She finished her antibiotic and steroid course back in August. She started to develop left knee pain after a 20 pound weight gain secondary to steroid use. Her dermatologist was concerned that she may have an autoimmune condition and did draw labs for this workup. It did show a slightly minimal positive AMA at 1:80. She denies any rashes, fevers, redness, shortness of breath, or calf pain.  Today she is most concerned about continued leg swelling and her knee pain. Both of her legs are swollen. She does elevate them and notices that she urinates frequently in the overnight. She is not as active as she used to be prior to having the vasculitis.  ROS: MSK: As noted in HPI Lungs: Denies SOB   Past Medical History:  Diagnosis Date  . Chicken pox   . Colon polyp   . Genital warts   . GERD (gastroesophageal reflux disease)   . Meningitis spinal 2012   Allergies  Allergen Reactions  . Other Other (See Comments)    Red meat- abdominal pain    Current Outpatient Prescriptions:  .  cholecalciferol (VITAMIN D) 1000 UNITS  tablet, Take 1,000 Units by mouth daily. Reported on 07/14/2016, Disp: , Rfl:  .  furosemide (LASIX) 40 MG tablet, Take 1 tablet (40 mg total) by mouth daily., Disp: 14 tablet, Rfl: 0 .  glucosamine-chondroitin 500-400 MG tablet, Take 1 tablet by mouth 3 (three) times daily. Reported on 07/14/2016, Disp: , Rfl:  .  Lorcaserin HCl (BELVIQ) 10 MG TABS, 1 po bid (Patient not taking: Reported on 09/25/2016), Disp: 60 tablet, Rfl: 2 .  Multiple Vitamin (MULTIVITAMIN) tablet, Take 1 tablet by mouth daily., Disp: , Rfl:  .  phentermine 37.5 MG capsule, Take 1 capsule (37.5 mg total) by mouth every morning. (Patient not taking: Reported on 09/25/2016), Disp: 30 capsule, Rfl: 0 .  silver sulfADIAZINE (SILVADENE) 1 % cream, Apply 1 application topically daily. (Patient not taking: Reported on 09/25/2016), Disp: 50 g, Rfl: 0  Objective: BP 130/78 (BP Location: Left Arm, Patient Position: Sitting, Cuff Size: Large)   Pulse 95   Temp 98.2 F (36.8 C) (Oral)   Ht 5' 8"  (1.727 m)   Wt 232 lb 3.2 oz (105.3 kg)   SpO2 99%   BMI 35.31 kg/m  General: Awake, appears stated age HEENT: MMM, EOMi Heart: RRR, no murmurs, 1+ pitting edema b/l up to prox 1/3 of tibia. Lungs: CTAB, no rales, wheezes or rhonchi. Normal effort MSK: Neg Homan's b/l. Entire LE b/l is TTP. Gait is normal. L knee- normal ROM, no TTP, no effusion, no erythema or significant warmth, neg Lelli's,  Lachman's, varus/valgus stress, patellar grind/apprehension, McMurray's, no joint line tenderness. Psych: Age appropriate judgment and insight, normal affect and mood  Assessment and Plan: Swelling of lower extremity - Plan: furosemide (LASIX) 40 MG tablet  Orders as above. After lengthy discussion over the possible causes of lower extremity edema as well as what I think is most likely in her case, we finally came to the plan. She gained a lot of weight due to her steroid use and has not yet lost it. Additionally, she's not been as physically active  as she was prior to her incident. Instructed that she needs to dedicate time to move, in her case walk, to help move the fluid off of her legs. Continue to elevate them. I will give a short course of Lasix as she cannot tolerate compression stockings given the wounds on her legs. I think DVT is less likely given her physical examination in the bilateral presentation. She had diffuse pain that was not limited to her calves when I was palpating her lower extremities. Motrin and Tylenol for the knee pain. I believe it will improve after she loses weight. Autoimmune labs were reviewed and discussed with the patient. With a negative ESR, CRP, and anti-CCP, I would be surprised if a rheumatologist would welcome this referral with only a weakly positive ANA titer and lack of signs/symptoms. I would like her to follow-up in 3-4 weeks if symptoms fail to improve. She is free to follow-up with me or normal PCP. At that time, I will consider imaging of her knee versus evaluating for compression stockings. The patient voiced understanding and agreement to the plan.  [place], DO 09/25/16  4:27 PM  >25 min were spent face to face with the patient and greater than 50% of this time was spent counseling and coordinating care.

## 2016-10-02 ENCOUNTER — Ambulatory Visit (HOSPITAL_BASED_OUTPATIENT_CLINIC_OR_DEPARTMENT_OTHER)
Admission: RE | Admit: 2016-10-02 | Discharge: 2016-10-02 | Disposition: A | Discharge status: Home / Self Care | Payer: 59 | Source: Ambulatory Visit | Attending: Family Medicine | Admitting: Family Medicine

## 2016-10-02 ENCOUNTER — Ambulatory Visit (INDEPENDENT_AMBULATORY_CARE_PROVIDER_SITE_OTHER): Payer: 59 | Admitting: Family Medicine

## 2016-10-02 ENCOUNTER — Other Ambulatory Visit: Payer: Self-pay | Admitting: Family Medicine

## 2016-10-02 ENCOUNTER — Encounter: Payer: Self-pay | Admitting: Family Medicine

## 2016-10-02 VITALS — BP 138/96 | HR 83 | Temp 98.0°F | Ht 67.0 in | Wt 229.4 lb

## 2016-10-02 DIAGNOSIS — Z23 Encounter for immunization: Secondary | ICD-10-CM

## 2016-10-02 DIAGNOSIS — M25762 Osteophyte, left knee: Secondary | ICD-10-CM | POA: Insufficient documentation

## 2016-10-02 DIAGNOSIS — M7989 Other specified soft tissue disorders: Secondary | ICD-10-CM | POA: Diagnosis not present

## 2016-10-02 DIAGNOSIS — M25562 Pain in left knee: Secondary | ICD-10-CM

## 2016-10-02 DIAGNOSIS — S91009A Unspecified open wound, unspecified ankle, initial encounter: Secondary | ICD-10-CM

## 2016-10-02 DIAGNOSIS — R2242 Localized swelling, mass and lump, left lower limb: Secondary | ICD-10-CM

## 2016-10-02 NOTE — Progress Notes (Signed)
Chief Complaint  Patient presents with  . Follow-up    Pt reports has been taking fluid pill and walking with no relief of symptoms; Pt reports feeling a knot in the wound where her incision has been made and wants to have that checked out to make sure it is healing properly    Subjective: Patient is a 50 y.o. female here for f/u swelling.  Swelling Swelling has improved. She is being more active and is taking the water pill since stockings are not the best option given the wounds on her legs. She is urinating more frequently, but still has yet to dedicated time to exercise. She also noted a mass in the back of her heel. It is most prominent at the end of the day.  Knee pain  Her knee is still painful. It hurts on the inside and is worse when she goes up and down stairs. She is having catching and popping. No known injury, mild swelling. No bruising, numbness, or tingling.  Wounds Ulcers from her vasculitis on her legs are healing very slowly. They have been there since July. No fevers, redness, or weeping. She was told by her dermatologist that it is healing appropriately and to keep it applied with bandages and Vaseline. The main reason for why it is taking so long is the swelling, which has not totally subsided.   ROS: Heart: Denies chest pain, +LE edema MSK: +L knee pain  No family history on file. Past Medical History:  Diagnosis Date  . Chicken pox   . Colon polyp   . Genital warts   . GERD (gastroesophageal reflux disease)   . Meningitis spinal 2012   Allergies  Allergen Reactions  . Other Other (See Comments)    Red meat- abdominal pain    Current Outpatient Prescriptions:  .  furosemide (LASIX) 40 MG tablet, Take 1 tablet (40 mg total) by mouth daily., Disp: 14 tablet, Rfl: 0 .  cholecalciferol (VITAMIN D) 1000 UNITS tablet, Take 1,000 Units by mouth daily. Reported on 07/14/2016, Disp: , Rfl:  .  glucosamine-chondroitin 500-400 MG tablet, Take 1 tablet by mouth 3  (three) times daily. Reported on 07/14/2016, Disp: , Rfl:  .  Lorcaserin HCl (BELVIQ) 10 MG TABS, 1 po bid (Patient not taking: Reported on 10/02/2016), Disp: 60 tablet, Rfl: 2 .  Multiple Vitamin (MULTIVITAMIN) tablet, Take 1 tablet by mouth daily., Disp: , Rfl:  .  phentermine 37.5 MG capsule, Take 1 capsule (37.5 mg total) by mouth every morning. (Patient not taking: Reported on 10/02/2016), Disp: 30 capsule, Rfl: 0 .  silver sulfADIAZINE (SILVADENE) 1 % cream, Apply 1 application topically daily. (Patient not taking: Reported on 10/02/2016), Disp: 50 g, Rfl: 0  Objective: Pulse 83   Temp 98 F (36.7 C) (Oral)   Ht 5\' 7"  (1.702 m)   Wt 229 lb 6.4 oz (104.1 kg)   LMP 03/29/2016   SpO2 99%   BMI 35.93 kg/m  General: Awake, appears stated age HEENT: MMM, EOMi Heart: RRR, no murmurs, 1+ pitting edema around ankles, improved from 1 week ago Lungs: CTAB, no rales, wheezes or rhonchi. Normal effort MSK: L knee- no effusion, nml ROM, +medial joint line tenderness, pain with McMurray's stress on medial knee, no true click, neg Lachman's, Patellar grind and apprehension Posterior L calf- I do not appreciate any mass behind her leg. She notes that it is not prominent currently.  Psych: Age appropriate judgment and insight, normal affect and mood  Assessment and  Plan: Acute pain of left knee - Plan: DG Knee Complete 4 Views Left  Leg swelling  Wound of ankle, unspecified laterality, initial encounter  Mass of leg, left - Plan: [place] Misc Soft Tissue  Orders as above. If XR knee is neg, will order MRI to evaluate for meniscal pathosis as that is my biggest concern at this time. Continue Lasix for another week, elevate legs, and exercise for the improving swelling. She has lost 3 lbs in 1 week.  For her wounds, she would like to wait on wound clinic referral until the LE swelling has subsided. F/u prn. The patient voiced understanding and agreement to the plan.  [place],  DO 10/02/16  11:04 AM

## 2016-10-02 NOTE — Patient Instructions (Signed)
I want you to stay active.

## 2016-10-09 ENCOUNTER — Telehealth: Payer: Self-pay | Admitting: Family Medicine

## 2016-10-09 NOTE — Telephone Encounter (Signed)
Caller name:Eulanda Heather Relationship to patient: Can be [place]:  Reason for call:requesting knee xray report, states she called on Friday and has not received a call back

## 2016-10-09 NOTE — Telephone Encounter (Signed)
Called and spoke with the pt and informed her of the knee x-ray result.  Pt verbalized understanding.  Informed the pt that a MRI order has been placed and I spoke with the [facility] and they are waiting for approval for the MRI from the insurance company.  Informed the pt that she will received a call from [facility] regarding an appt.  Pt verbalized understanding.//AB/CMA

## 2016-10-10 ENCOUNTER — Telehealth: Payer: Self-pay | Admitting: *Deleted

## 2016-10-10 DIAGNOSIS — M25562 Pain in left knee: Secondary | ICD-10-CM

## 2016-10-10 NOTE — Telephone Encounter (Signed)
-----   Message from Synthia Innocent sent at 10/10/2016  9:33 AM EDT ----- Regarding: FW: ULTRASOUND Please see below about changing order. Thanks  ----- Message ----- From: Katha Hamming Sent: 10/10/2016   8:44 AM To: Synthia Innocent Subject: FW: ULTRASOUND                                   ----- Message ----- From: Katha Hamming Sent: 10/08/2016   9:09 AM To: Margot Ables Subject: RE: ULTRASOUND                                 Yes it needs to be changed to code [id number], US extremity low left ltd.  They do not [place] the [place] misc any more.  I will schedule both the MRI and ultrasound on same day once I find out about the MRI.  Thanks Chanetta Marshall, Hoyle Sauer ----- Message ----- From: Margot Ables Sent: 10/03/2016   3:23 PM To: Katha Hamming Subject: RE: ULTRASOUND                                 He said yes. [place] is for soft tissue on achilles tendon. Let me know if it needs changed. ----- Message ----- From: Katha Hamming Sent: 10/03/2016   1:16 PM To: Margot Ables Subject: ULTRASOUND                                     There is an order for a misc soft tissue ultrasound for this patient as well as MRI.  Did Dr. Nani Ravens still want this ultrasound?  If so, we might have to get the order changed.  I don't think they use this order any more.  Thanks, Hoyle Sauer

## 2016-10-16 ENCOUNTER — Ambulatory Visit (HOSPITAL_BASED_OUTPATIENT_CLINIC_OR_DEPARTMENT_OTHER): Payer: 59

## 2017-10-17 LAB — HM DEXA SCAN: HM Dexa Scan: NORMAL

## 2018-04-15 ENCOUNTER — Encounter: Payer: Self-pay | Admitting: Family Medicine

## 2018-04-15 ENCOUNTER — Ambulatory Visit (HOSPITAL_BASED_OUTPATIENT_CLINIC_OR_DEPARTMENT_OTHER)
Admission: RE | Admit: 2018-04-15 | Discharge: 2018-04-15 | Disposition: A | Discharge status: Home / Self Care | Payer: 59 | Source: Ambulatory Visit | Attending: Family Medicine | Admitting: Family Medicine

## 2018-04-15 ENCOUNTER — Ambulatory Visit (INDEPENDENT_AMBULATORY_CARE_PROVIDER_SITE_OTHER): Payer: 59 | Admitting: Family Medicine

## 2018-04-15 VITALS — BP 132/76 | HR 84 | Temp 98.2°F | Resp 16 | Ht 66.93 in | Wt 236.4 lb

## 2018-04-15 DIAGNOSIS — H6123 Impacted cerumen, bilateral: Secondary | ICD-10-CM | POA: Diagnosis not present

## 2018-04-15 DIAGNOSIS — H9201 Otalgia, right ear: Secondary | ICD-10-CM

## 2018-04-15 DIAGNOSIS — M47812 Spondylosis without myelopathy or radiculopathy, cervical region: Secondary | ICD-10-CM | POA: Insufficient documentation

## 2018-04-15 DIAGNOSIS — H9202 Otalgia, left ear: Secondary | ICD-10-CM | POA: Diagnosis not present

## 2018-04-15 DIAGNOSIS — R42 Dizziness and giddiness: Secondary | ICD-10-CM | POA: Insufficient documentation

## 2018-04-15 DIAGNOSIS — M542 Cervicalgia: Secondary | ICD-10-CM | POA: Diagnosis not present

## 2018-04-15 DIAGNOSIS — M2578 Osteophyte, vertebrae: Secondary | ICD-10-CM | POA: Insufficient documentation

## 2018-04-15 LAB — COMPREHENSIVE METABOLIC PANEL
ALK PHOS: 81 U/L (ref 39–117)
ALT: 13 U/L (ref 0–35)
AST: 15 U/L (ref 0–37)
Albumin: 3.9 g/dL (ref 3.5–5.2)
BILIRUBIN TOTAL: 0.3 mg/dL (ref 0.2–1.2)
BUN: 13 mg/dL (ref 6–23)
CO2: 30 mEq/L (ref 19–32)
CREATININE: 0.79 mg/dL (ref 0.40–1.20)
Calcium: 9.5 mg/dL (ref 8.4–10.5)
Chloride: 102 mEq/L (ref 96–112)
GFR: 80.57 mL/min (ref >60.00)
Glucose, Bld: 85 mg/dL (ref 70–99)
Potassium: 3.9 mEq/L (ref 3.5–5.1)
Sodium: 138 mEq/L (ref 135–145)
TOTAL PROTEIN: 6.7 g/dL (ref 6.0–8.3)

## 2018-04-15 LAB — CBC WITH DIFFERENTIAL/PLATELET
BASOS ABS: 0 10*3/uL (ref 0.0–0.1)
Basophils Relative: 0.4 % (ref 0.0–3.0)
EOS ABS: 0.1 10*3/uL (ref 0.0–0.7)
Eosinophils Relative: 1.5 % (ref 0.0–5.0)
HEMATOCRIT: 41.2 % (ref 36.0–46.0)
Hemoglobin: 13.8 g/dL (ref 12.0–15.0)
LYMPHS PCT: 27.5 % (ref 12.0–46.0)
Lymphs Abs: 1.4 10*3/uL (ref 0.7–4.0)
MCHC: 33.5 g/dL (ref 30.0–36.0)
MCV: 83.9 fl (ref 78.0–100.0)
MONOS PCT: 7.3 % (ref 3.0–12.0)
Monocytes Absolute: 0.4 10*3/uL (ref 0.1–1.0)
Neutro Abs: 3.2 10*3/uL (ref 1.4–7.7)
Neutrophils Relative %: 63.3 % (ref 43.0–77.0)
Platelets: 270 10*3/uL (ref 150.0–400.0)
RBC: 4.91 Mil/uL (ref 3.87–5.11)
RDW: 14.8 % (ref 11.5–15.5)
WBC: 5.1 10*3/uL (ref 4.0–10.5)

## 2018-04-15 LAB — POC URINALSYSI DIPSTICK (AUTOMATED)
BILIRUBIN UA: NEGATIVE
Glucose, UA: NEGATIVE
KETONES UA: NEGATIVE
Leukocytes, UA: NEGATIVE
Nitrite, UA: NEGATIVE
Protein, UA: NEGATIVE
RBC UA: NEGATIVE
SPEC GRAV UA: 1.015 (ref 1.010–1.025)
UROBILINOGEN UA: 0.2 U/dL
pH, UA: 6 (ref 5.0–8.0)

## 2018-04-15 LAB — VITAMIN B12: VITAMIN B 12: 250 pg/mL (ref 211–911)

## 2018-04-15 LAB — TSH: TSH: 1.7 u[IU]/mL (ref 0.35–4.50)

## 2018-04-15 MED ORDER — OFLOXACIN 0.3 % OT SOLN: 5.0000 [drp] | Freq: Every day | OTIC | 0 refills | Status: DC

## 2018-04-15 MED ORDER — MECLIZINE HCL 25 MG PO TABS: 25.0000 mg | ORAL_TABLET | Freq: Three times a day (TID) | ORAL | 0 refills | Status: DC | PRN

## 2018-04-15 NOTE — Progress Notes (Signed)
Subjective:  I acted as a Education administrator for [employer]. Yancey Flemings, [place]   Patient ID: Emily [name], female    DOB: Feb 05, 1966, 52 y.o.   MRN: [id number]  Chief Complaint  Patient presents with  . Dizziness    HPI  Patient is in today for vertigo.  It started last Tues, wed, thurs.  Friday she woke up with dizziness and it past and then she woke up at 730 and had to go to bathroom-- she needed help to go to bathroom due to dizziness and then she got nauseous.  She vomited a few times and nausea and dizziness con't.  She then got to the couch-- she took Bonine and they next day she was better but balance didn't come back until Monday.  She is concerned because she had meningitis many years ago----  She got dizzy then too but had a headache and fever with locked up muscles as well.  She only had dizziness, N/V this time and she said today she is almost 100% better but when she turns her head her neck cracks and she gets slightly dizzy.  She has had several chiropractors tell her she has a terrible neck and the dr that did her Lumbar puncture when she had meningitis said her neck looked terrible but she never had it checked.  She denies any fever, headaches, chest pain, palpitations, no loc  Patient Care Team: Carollee Herter, Alferd Apa, DO as PCP - General (Family Medicine) Jonna Clark, MD as Referring Physician (Obstetrics and Gynecology)   Past Medical History:  Diagnosis Date  . Chicken pox   . Colon polyp   . Genital warts   . GERD (gastroesophageal reflux disease)   . Meningitis spinal 2012    Past Surgical History:  Procedure Laterality Date  . BREAST SURGERY  '84  . CESAREAN SECTION  '92, '01   x's 2    History reviewed. No pertinent family history.  Social History   Socioeconomic History  . Marital status: Married    Spouse name: Not on file  . Number of children: Not on file  . Years of education: Not on file  . 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 Financial resource strain: Not on file  . Food insecurity:    Worry: Not on file    Inability: Not on file  . Transportation needs:    Medical: Not on file    Non-medical: Not on file  Tobacco Use  . Smoking status: Never Smoker  . Smokeless tobacco: Never Used  Substance and Sexual Activity  . Alcohol use: Yes    Alcohol/week: 0.0 oz  . Drug use: No  . Sexual activity: Yes    Partners: Male  Lifestyle  . Physical activity:    Days per week: Not on file    Minutes per session: Not on file  . Stress: Not on file  Relationships  . Social connections:    Talks on phone: Not on file    Gets together: Not on file    Attends religious service: Not on file    Active member of club or organization: Not on file    Attends meetings of clubs or organizations: Not on file    Relationship status: Not on file  . Intimate partner violence:    Fear of current or ex partner: Not on file    Emotionally abused: Not on file  Physically abused: Not on file    Forced sexual activity: Not on file  Other Topics Concern  . Not on file  Social History Narrative   Exercise-- no    Outpatient Medications Prior to Visit  Medication Sig Dispense Refill  . cholecalciferol (VITAMIN D) 1000 UNITS tablet Take 1,000 Units by mouth daily. Reported on 07/14/2016    . furosemide (LASIX) 40 MG tablet Take 1 tablet (40 mg total) by mouth daily. 14 tablet 0  . glucosamine-chondroitin 500-400 MG tablet Take 1 tablet by mouth 3 (three) times daily. Reported on 07/14/2016    . Lorcaserin HCl (BELVIQ) 10 MG TABS 1 po bid (Patient not taking: Reported on 10/02/2016) 60 tablet 2  . Multiple Vitamin (MULTIVITAMIN) tablet Take 1 tablet by mouth daily.    . phentermine 37.5 MG capsule Take 1 capsule (37.5 mg total) by mouth every morning. (Patient not taking: Reported on 10/02/2016) 30 capsule 0  . silver sulfADIAZINE (SILVADENE) 1 % cream Apply 1 application topically daily.  (Patient not taking: Reported on 10/02/2016) 50 g 0   No facility-administered medications prior to visit.     Allergies  Allergen Reactions  . Other Other (See Comments)    Red meat- abdominal pain    Review of Systems  Constitutional: Negative for chills, fever and malaise/fatigue.  HENT: Negative for congestion and hearing loss.   Eyes: Negative for discharge.  Respiratory: Negative for cough, sputum production and shortness of breath.   Cardiovascular: Negative for chest pain, palpitations and leg swelling.  Gastrointestinal: Negative for abdominal pain, blood in stool, constipation, diarrhea, heartburn, nausea and vomiting.  Genitourinary: Negative for dysuria, frequency, hematuria and urgency.  Musculoskeletal: Negative for back pain, falls and myalgias.  Skin: Negative for rash.  Neurological: Positive for dizziness. Negative for sensory change, loss of consciousness, weakness and headaches.  Endo/Heme/Allergies: Negative for environmental allergies. Does not bruise/bleed easily.  Psychiatric/Behavioral: Negative for depression and suicidal ideas. The patient is not nervous/anxious and does not have insomnia.        Objective:    Physical Exam  Constitutional: She is oriented to person, place, and time. She appears well-developed and well-nourished.  HENT:  Head: Normocephalic and atraumatic.  Ears:  Eyes: Conjunctivae and EOM are normal.  Neck: Normal range of motion. Neck supple. No JVD present. Carotid bruit is not present. No thyromegaly present.  Cardiovascular: Normal rate, regular rhythm and normal heart sounds.  No murmur heard. Pulmonary/Chest: Effort normal and breath sounds normal. No respiratory distress. She has no wheezes. She has no rales. She exhibits no tenderness.  Musculoskeletal: She exhibits no edema.  Neurological: She is alert and oriented to person, place, and time.  Psychiatric: She has a normal mood and affect.  Nursing note and vitals  reviewed.   BP 132/76 (BP Location: Left Arm, Patient Position: Sitting, Cuff Size: Large)   Pulse 84   Temp 98.2 F (36.8 C) (Oral)   Resp 16   Ht 5' 6.93" (1.7 m)   Wt 236 lb 6.4 oz (107.2 kg)   LMP 03/29/2016   SpO2 97%   BMI 37.10 kg/m  Wt Readings from Last 3 Encounters:  04/15/18 236 lb 6.4 oz (107.2 kg)  10/02/16 229 lb 6.4 oz (104.1 kg)  09/25/16 232 lb 3.2 oz (105.3 kg)   BP Readings from Last 3 Encounters:  04/15/18 132/76  10/02/16 (!) 138/96  09/25/16 130/78     Immunization History  Administered Date(s) Administered  . Influenza,inj,Quad PF,6+ Mos  11/20/2015, 10/02/2016  . Tdap 07/27/2015    Health Maintenance  Topic Date Due  . HIV Screening  02/24/1979  . PAP SMEAR  12/29/2016  . MAMMOGRAM  05/28/2018  . INFLUENZA VACCINE  07/29/2018  . COLONOSCOPY  12/29/2024  . TETANUS/TDAP  07/26/2025  . Hepatitis C Screening  Completed    Lab Results  Component Value Date   WBC 5.1 04/15/2018   HGB 13.8 04/15/2018   HCT 41.2 04/15/2018   PLT 270.0 04/15/2018   GLUCOSE 85 04/15/2018   CHOL 207 (H) 09/27/2015   TRIG 167.0 (H) 09/27/2015   HDL 48.70 09/27/2015   LDLCALC 124 (H) 09/27/2015   ALT 13 04/15/2018   AST 15 04/15/2018   NA 138 04/15/2018   K 3.9 04/15/2018   CL 102 04/15/2018   CREATININE 0.79 04/15/2018   BUN 13 04/15/2018   CO2 30 04/15/2018   TSH 1.70 04/15/2018    Lab Results  Component Value Date   TSH 1.70 04/15/2018   Lab Results  Component Value Date   WBC 5.1 04/15/2018   HGB 13.8 04/15/2018   HCT 41.2 04/15/2018   MCV 83.9 04/15/2018   PLT 270.0 04/15/2018   Lab Results  Component Value Date   NA 138 04/15/2018   K 3.9 04/15/2018   CO2 30 04/15/2018   GLUCOSE 85 04/15/2018   BUN 13 04/15/2018   CREATININE 0.79 04/15/2018   BILITOT 0.3 04/15/2018   ALKPHOS 81 04/15/2018   AST 15 04/15/2018   ALT 13 04/15/2018   PROT 6.7 04/15/2018   ALBUMIN 3.9 04/15/2018   CALCIUM 9.5 04/15/2018   GFR 80.57 04/15/2018   Lab  Results  Component Value Date   CHOL 207 (H) 09/27/2015   Lab Results  Component Value Date   HDL 48.70 09/27/2015   Lab Results  Component Value Date   LDLCALC 124 (H) 09/27/2015   Lab Results  Component Value Date   TRIG 167.0 (H) 09/27/2015   Lab Results  Component Value Date   CHOLHDL 4 09/27/2015   No results found for: HGBA1C       Assessment & Plan:   Problem List Items Addressed This Visit      Unprioritized   Bilateral impacted cerumen   Dizziness - Primary    ? Etiology Check labs Check xray of neck antivert prn if dizziness returns Consider neuro referral       Relevant Medications   meclizine (ANTIVERT) 25 MG tablet   Other Relevant Orders   CBC with Differential/Platelet (Completed)   Comprehensive metabolic panel (Completed)   TSH (Completed)   POCT Urinalysis Dipstick (Automated) (Completed)   Vitamin B12 (Completed)   Left ear pain    May be contributing to dizziness floxin otic Unable to remove wax in l ear Can try again after tx       Relevant Medications   ofloxacin (FLOXIN OTIC) 0.3 % OTIC solution    Other Visit Diagnoses    Neck pain       Relevant Orders   DG Cervical Spine Complete (Completed)    with her hx of "neck problems" and light headed feeling with turning head--- I feel this needs to be evaluated more Start with xray-- pt is in agreement   I have discontinued Analysse Scrivner's cholecalciferol, glucosamine-chondroitin, Lorcaserin HCl, multivitamin, silver sulfADIAZINE, phentermine, and furosemide. I am also having her start on meclizine and ofloxacin.  Meds ordered this encounter  Medications  . meclizine (ANTIVERT) 25 MG tablet  Sig: Take 1 tablet (25 mg total) by mouth 3 (three) times daily as needed for dizziness.    Dispense:  30 tablet    Refill:  0  . ofloxacin (FLOXIN OTIC) 0.3 % OTIC solution    Sig: Place 5 drops into the left ear daily.    Dispense:  5 mL    Refill:  0    CMA served as scribe  during this visit. History, Physical and Plan performed by medical provider. Documentation and orders reviewed and attested to.  Ann Held, DO

## 2018-04-15 NOTE — Assessment & Plan Note (Addendum)
?   Etiology Check labs Check xray of neck antivert prn if dizziness returns Consider neuro referral

## 2018-04-15 NOTE — Assessment & Plan Note (Addendum)
May be contributing to dizziness floxin otic Unable to remove wax in l ear Can try again after tx

## 2018-04-15 NOTE — Patient Instructions (Signed)

## 2018-04-16 ENCOUNTER — Other Ambulatory Visit: Payer: Self-pay | Admitting: Family Medicine

## 2018-04-16 DIAGNOSIS — M4722 Other spondylosis with radiculopathy, cervical region: Secondary | ICD-10-CM

## 2018-04-20 ENCOUNTER — Encounter: Payer: Self-pay | Admitting: Family Medicine

## 2018-04-21 NOTE — Telephone Encounter (Signed)
Sent a referral message to you about the MRI it was denied, needing a peer to peer or letter for reconsideration done.

## 2018-04-22 NOTE — Telephone Encounter (Signed)
I am aware she did not have shoulder pain when she was here-- but she had had it and then with the neck issues ---  It makes sense--- I don't understand why she has no neck pain but  Its ok to refer to neuro

## 2018-05-03 ENCOUNTER — Telehealth: Payer: Self-pay | Admitting: *Deleted

## 2018-05-03 ENCOUNTER — Other Ambulatory Visit: Payer: Self-pay | Admitting: *Deleted

## 2018-05-03 DIAGNOSIS — R42 Dizziness and giddiness: Secondary | ICD-10-CM

## 2018-05-03 NOTE — Telephone Encounter (Signed)
Copied from [place] [phone]. Topic: Inquiry >> May 03, 2018 11:47 AM Neva Seat wrote: Pt asking Dr. Carollee Herter to give her a call back to discuss her lab results.  She tried to speak with Dr. Carollee Herter by MyChart w/ no reply. Pt needing to discuss asap.

## 2018-05-03 NOTE — Telephone Encounter (Signed)
Copied from [place] [phone]. Topic: Inquiry >> May 03, 2018 11:47 AM Neva Seat wrote: Pt asking Dr. Carollee Herter to give her a call back to discuss her lab results.  She tried to speak with Dr. Carollee Herter by MyChart w/ no reply. Pt needing to discuss asap.

## 2018-05-03 NOTE — Telephone Encounter (Signed)
Spoke with patient about her message in [place] from 04/20/18.  Advised her of what Dr. Etter Sjogren responded.  She feels like that the provider should have called her.  Her issues for me were very hard for me to explain to her for her to understand.  She had a lot of lab tests and xray.  She would like a call from the manager in regards to this.  She would like to see someone else in the office if possible because this has happened before.  Back then they told her she could not do this.  Can you please call patient.  Referral has been placed for patient

## 2018-05-04 NOTE — Telephone Encounter (Signed)
Spoke with patient in regards to her concern with the communication she received from our office. Patient states she did not understand the information that was send to her Mychart account and waited days after to hear from someone in our office who admitted to not understanding the information either. She has had this similar instance in the past and just feels she would be better cared for by another Physician that is not as busy and could take the time to explain things to her and make her feel cared for. She has requested to transfer care to another office because she is happy with [place], has made her an appt with Dr. Ethelene Hal at [facility] which is accepting new patients and the office is also convenient for her. Patient was appreciative and has scheduled her appt for 5/10 @ 10:30

## 2018-05-07 ENCOUNTER — Encounter: Payer: Self-pay | Admitting: Family Medicine

## 2018-05-07 ENCOUNTER — Ambulatory Visit: Payer: 59 | Admitting: Family Medicine

## 2018-05-07 VITALS — BP 126/80 | HR 72 | Ht 66.93 in | Wt 240.4 lb

## 2018-05-07 DIAGNOSIS — M5030 Other cervical disc degeneration, unspecified cervical region: Secondary | ICD-10-CM | POA: Insufficient documentation

## 2018-05-07 DIAGNOSIS — Z0000 Encounter for general adult medical examination without abnormal findings: Secondary | ICD-10-CM | POA: Insufficient documentation

## 2018-05-07 DIAGNOSIS — H8309 Labyrinthitis, unspecified ear: Secondary | ICD-10-CM | POA: Insufficient documentation

## 2018-05-07 NOTE — Progress Notes (Signed)
Subjective:  Patient ID: Emily [name], female    DOB: 1966/10/17  Age: 52 y.o. MRN: [id number]  CC: Establish Care   HPI Emily [name] presents for follow-up of acute episode of apparent labyrinthitis that had begun approximately 4 weeks ago.  She had the classic presentation of spinning and the sensation of movement when she was not.  A week or 2 after the onset she presented to her primary care who prescribed Antivert and applied her with information regarding the Epley maneuvers.  She never had to take the Antivert and never used the Epley maneuvers because her symptoms resolved.  She is also concerned about what is been described as degenerative disc disease and osteoarthritis of her cervical spine and whether or not this led to her symptoms.  While her initial symptoms were exacerbated by sudden change in head position she denies an ongoing history of dizziness or lightheadedness when she looks up.  She has no history of vascular disease.  There is no strong family history of vascular disease either.  She does not smoke or use illicit drugs.  She drinks a glass of wine on occasion.  She does have a past medical history of TMJ disease of bothers her from time to time.  Outpatient Medications Prior to Visit  Medication Sig Dispense Refill  . meclizine (ANTIVERT) 25 MG tablet Take 1 tablet (25 mg total) by mouth 3 (three) times daily as needed for dizziness. 30 tablet 0  . ofloxacin (FLOXIN OTIC) 0.3 % OTIC solution Place 5 drops into the left ear daily. 5 mL 0   No facility-administered medications prior to visit.     ROS Review of Systems  Constitutional: Negative for chills, fatigue, fever and unexpected weight change.  HENT: Negative.  Negative for hearing loss.   Eyes: Negative.   Respiratory: Negative.   Cardiovascular: Negative.   Gastrointestinal: Negative.   Genitourinary: Negative.   Musculoskeletal: Negative for neck pain and neck stiffness.  Skin: Negative.     Allergic/Immunologic: Negative for immunocompromised state.  Neurological: Negative for dizziness, light-headedness and headaches.  Hematological: Does not bruise/bleed easily.  Psychiatric/Behavioral: Negative.     Objective:  BP 126/80   Pulse 72   Ht 5' 6.93" (1.7 m)   Wt 240 lb 6 oz (109 kg)   LMP 03/29/2016   SpO2 99%   BMI 37.73 kg/m   BP Readings from Last 3 Encounters:  05/07/18 126/80  04/15/18 132/76  10/02/16 (!) 138/96    Wt Readings from Last 3 Encounters:  05/07/18 240 lb 6 oz (109 kg)  04/15/18 236 lb 6.4 oz (107.2 kg)  10/02/16 229 lb 6.4 oz (104.1 kg)    Physical Exam  Constitutional: She is oriented to person, place, and time. She appears well-developed and well-nourished. No distress.  HENT:  Head: Normocephalic and atraumatic.  Right Ear: External ear normal.  Left Ear: External ear normal.  Nose: Nose normal.  Mouth/Throat: Oropharynx is clear and moist. No oropharyngeal exudate.  Eyes: Pupils are equal, round, and reactive to light. Conjunctivae and EOM are normal. Right eye exhibits no discharge. Left eye exhibits no discharge. No scleral icterus.  Neck: Normal range of motion. Neck supple. No JVD present. No tracheal deviation present. No thyromegaly present.  Cardiovascular: Normal rate, regular rhythm and normal heart sounds.  Pulmonary/Chest: Effort normal and breath sounds normal.  Abdominal: Bowel sounds are normal.  Musculoskeletal: She exhibits no edema.       Cervical back: She exhibits normal  range of motion, no tenderness and no bony tenderness.       Back:  Neurological: She is alert and oriented to person, place, and time.  Patient denied symptoms of dizziness when looking up.   Skin: Skin is warm and dry. She is not diaphoretic. No erythema. No pallor.  Psychiatric: She has a normal mood and affect. Her behavior is normal.    Lab Results  Component Value Date   WBC 5.1 04/15/2018   HGB 13.8 04/15/2018   HCT 41.2 04/15/2018    PLT 270.0 04/15/2018   GLUCOSE 85 04/15/2018   CHOL 207 (H) 09/27/2015   TRIG 167.0 (H) 09/27/2015   HDL 48.70 09/27/2015   LDLCALC 124 (H) 09/27/2015   ALT 13 04/15/2018   AST 15 04/15/2018   NA 138 04/15/2018   K 3.9 04/15/2018   CL 102 04/15/2018   CREATININE 0.79 04/15/2018   BUN 13 04/15/2018   CO2 30 04/15/2018   TSH 1.70 04/15/2018    Dg Cervical Spine Complete  Result Date: 04/16/2018 CLINICAL DATA:  Shoulder pain right side.  History of vertigo. EXAM: CERVICAL SPINE - COMPLETE 4+ VIEW COMPARISON:  No prior. FINDINGS: Diffuse degenerative change with straightening cervical spine. Degenerative changes are particularly prominent at C5-C6 and C6-C7. Prominent disc space loss and endplate osteophyte formation at these levels. Multifocal bilateral neural foraminal narrowing is present secondary to degenerative change present. No evidence of fracture or dislocation. Pulmonary apices are clear. IMPRESSION: Diffuse multilevel degenerative change with straightening cervical spine. Degenerative changes are particular prominence C5-C6-C6-C7. Prominent disc space loss and endplate osteophyte formation at these levels. Multifocal bilateral neural foraminal narrowing is present. Electronically Signed   By: Marcello Moores  Register   On: 04/16/2018 06:14    Assessment & Plan:   Emily [name] was seen today for establish care.  Diagnoses and all orders for this visit:  Labyrinthitis, unspecified laterality  Healthcare maintenance -     Lipid panel  Degenerative disc disease, cervical   I have discontinued Mafalda Liebert's meclizine and ofloxacin.  No orders of the defined types were placed in this encounter. Patient asked what to do about the osteoarthritis in his cervical spine.  She is currently having no symptoms from this disease at this time.  Advised her to be aware and practice good posture with her cervical spine.  No treatment is necessary at this time.  I demonstrated proper neck  posture.  I doubt that vertebral vertebral basilar insufficiency is the cause of her dizziness and a normal lipid profile should support that notion.  She will return fasting for lipids.  I spent over 25 minutes with this patient with over 50% of the time spent in counseling.   Follow-up: Return if symptoms worsen or fail to improve.  Libby Maw, MD

## 2018-10-18 LAB — HM MAMMOGRAPHY

## 2018-11-05 ENCOUNTER — Encounter: Payer: Self-pay | Admitting: Family Medicine

## 2018-12-29 LAB — HM COLONOSCOPY

## 2019-10-13 ENCOUNTER — Telehealth: Payer: Self-pay | Admitting: Family Medicine

## 2019-10-13 DIAGNOSIS — H8110 Benign paroxysmal vertigo, unspecified ear: Secondary | ICD-10-CM

## 2019-10-13 NOTE — Telephone Encounter (Signed)
Patient is calling for a referral [facility] that has returned Patient is wanting referral Alonza Smoker relocating to [place].- Rudell Cobb will be relocating to the [place] practice next week. [facility]. [phone]  Call Back Patient-[phone]

## 2019-10-14 NOTE — Telephone Encounter (Signed)
lmovm for pt letting her know that referral has been placed & to call back to schedule a lab appt.

## 2019-10-14 NOTE — Telephone Encounter (Signed)
Referral okay. Please return for fasting lipid profile as requested some months ago.

## 2019-10-18 ENCOUNTER — Other Ambulatory Visit: Payer: Self-pay

## 2019-10-18 ENCOUNTER — Ambulatory Visit (INDEPENDENT_AMBULATORY_CARE_PROVIDER_SITE_OTHER): Payer: Managed Care, Other (non HMO) | Admitting: Rehabilitative and Restorative Service Providers"

## 2019-10-18 ENCOUNTER — Encounter: Payer: Self-pay | Admitting: Rehabilitative and Restorative Service Providers"

## 2019-10-18 DIAGNOSIS — R42 Dizziness and giddiness: Secondary | ICD-10-CM | POA: Diagnosis not present

## 2019-10-18 NOTE — Therapy (Addendum)
[place], [place], [address] Phone: [phone]   Fax:  [phone]  Physical Therapy Evaluation  Patient Details  Name: Emily [name] MRN: [id number] Date of Birth: [birth year] Referring Provider (PT): Abelino Derrick, MD   Encounter Date: 10/17/2054  PT End of Session - 10/18/19 1226    Visit Number  1    Number of Visits  4    Date for PT Re-Evaluation  11/17/19    Authorization Type  [employer]    PT Start Time  1015    PT Stop Time  1115    PT Time Calculation (min)  60 min    Activity Tolerance  Patient tolerated treatment well    Behavior During Therapy  [facility] for tasks assessed/performed       Past Medical History:  Diagnosis Date  . Chicken pox   . Colon polyp   . Genital warts   . GERD (gastroesophageal reflux disease)   . Meningitis spinal 2012    Past Surgical History:  Procedure Laterality Date  . BREAST SURGERY  '84  . CESAREAN SECTION  '92, '01   x's 2    There were no vitals filed for this visit.   Subjective Assessment - 10/17/54 1018    Subjective  The patient reports that in 2012 she developed a viral meningitis when traveling and had her first episode of vertigo at that time.  Her symptoms of vertigo happened with a quick turn in the car leading to nausea/vomitting that lasted hours.   18 months ago on a retreat, she developed dizziness upon rising in the morning and had severe vertigo and imbalance in which she needed assist to walk.  Her symptoms at that time were room spinning, nausea, vomitting.  The sensation of dizziness lasted x hours, however she avoided driving x 3 days and didn't feel fully recovered for 5-6 days.  In early October, she woke with a sensation of vertigo.  She looked online and did the 1/2 somersalt maneuver.  She felt that this "pushed the dizziness back".  At lunch that day she tried the full sommersalt maneuver and it led to nausea/vomitting.  She  felt improved after taking meclizine and sleeping for 2 hours.   Last Friday, she also had a spell of dizziness while sitting and sorting fabric.  She was unable to drive but notes the dizziness would come and go.  She also gets a tension headache with these episodes.  Keeping her head straight helped reduce sensation of dizziness.  She still does not feel 100% at this time noting it is coming and going.   She notes some slight fullness in her ears, no hearing changes, She feels turning to the left aggravates symptoms.   "It also feels like a hum goes over my head."  She notes having a migraine once in her lifetime.    Patient Stated Goals  Hoping to make vertigo resolve and know if there is anything to prevent these episodes.    Currently in Pain?  No/denies         [facility] PT Assessment - 10/18/19 1045      Assessment   Medical Diagnosis  vertigo    Referring Provider (PT)  Abelino Derrick, MD    Onset Date/Surgical Date  10/03/19    Prior Therapy  none      Precautions   Precautions  Fall      Restrictions   Weight Bearing Restrictions  No      Balance Screen   Has the patient fallen in the past 6 months  No    Has the patient had a decrease in activity level because of a fear of falling?   No   limited activity when she has a spell   Is the patient reluctant to leave their home because of a fear of falling?   No      Home Environment   Living Environment  Private residence      Prior Function   Level of Independence  Independent           Vestibular Assessment - 10/18/19 1046      Symptom Behavior   Subjective history of current problem  initial episode 2012, recurrent 2019, recurrence 2020    Type of Dizziness   Spinning;Imbalance    Frequency of Dizziness  intermittent    Duration of Dizziness  hours to days    Symptom Nature  Motion provoked   can occur with sitting still   Aggravating Factors  Activity in general;Mornings    Relieving Factors  Head stationary     Progression of Symptoms  Better      Oculomotor Exam   Oculomotor Alignment  Normal    Ocular ROM  WFLs    Spontaneous  Absent    Gaze-induced   Absent    Smooth Pursuits  Intact    Saccades  Intact      Vestibulo-Ocular Reflex   VOR 1 Head Only (x 1 viewing)  Slow VOR= does not provoke symptoms; feels apprehension.  She did have some difficulty keeping target fixation.  "Feels strange".    Comment  Head impulse test=R side noted a quick refixation saccade (covert saccade), L WNLs maintaining fixation.  Notes 1 minute after testing a sensation that dizziness is increasing.  She sits to let it settle.       Positional Testing   Dix-Hallpike  Dix-Hallpike Right;Dix-Hallpike Left    Sidelying Test  Sidelying Right;Sidelying Left    Horizontal Canal Testing  Horizontal Canal Right;Horizontal Canal Left      Dix-Hallpike Right   Dix-Hallpike Right Duration  none    Dix-Hallpike Right Symptoms  No nystagmus      Dix-Hallpike Left   Dix-Hallpike Left Duration  none    Dix-Hallpike Left Symptoms  No nystagmus      Sidelying Right   Sidelying Right Duration  Feels a little "shifty" and a sensation of head rush upon return to sitting    Sidelying Right Symptoms  No nystagmus      Sidelying Left   Sidelying Left Duration  Sensation of "shifting"  settles in 20 seconds    Sidelying Left Symptoms  No nystagmus   viewed in room light     Horizontal Canal Right   Horizontal Canal Right Duration  none    Horizontal Canal Right Symptoms  Normal      Horizontal Canal Left   Horizontal Canal Left Duration  noneno          Objective measurements completed on examination: See above findings.       Vestibular Treatment/Exercise - 10/18/19 1102      Vestibular Treatment/Exercise   Vestibular Treatment Provided  Gaze    Gaze Exercises  X1 Viewing Horizontal      X1 Viewing Horizontal   Foot Position  seated     Comments  x 20 seconds provoking a sensation of apprehension  Self Care:  Extensive education regarding role of habituation, gaze adaptation and nature of condition provided.         PT Long Term Goals - 10/18/19 1226      PT LONG TERM GOAL #1   Title  The patient will be independent with HEP for gaze adaptation, habituation, and general high level balance.    Time  4    Period  Weeks    Target Date  11/17/19      PT LONG TERM GOAL #2   Title  The patient will tolerate gaze x 1 viewing x 60 seconds to demo improved motion tolerance.    Time  4    Period  Weeks    Target Date  11/17/19      PT LONG TERM GOAL #3   Title  The patient will be further assessed for multi-sensory balance challenges and goal to follow, if indicated.    Time  4    Period  Weeks    Target Date  11/17/19             Plan - 10/18/19 1232    Clinical Impression Statement  The patient is a 53 year old female presenting to outpatient physical therapy with h/o episodic vertigo that lasts for hours and is made worse with head motion.  She had an episode that lasted days earlier in October 2020, and then returned this past friday to a milder degree after being under anesthesia for colonoscopy.  She has + R head impulse test, motion sensitivity with sit<>sidelying (no nystagmus viewed in room light), and decreased VOR. PT initiated gaze adaptation and plans to further assess as needed.    Examination-Activity Limitations  Locomotion Level   during episode of vertigo   Examination-Participation Restrictions  Driving    Stability/Clinical Decision Making  Stable/Uncomplicated    Rehab Potential  Good    PT Frequency  1x / week    PT Duration  4 weeks    PT Treatment/Interventions  ADLs/Self Care Home Management;Canalith Repostioning;Vestibular;Patient/family education;Balance training;Functional mobility training;Neuromuscular re-education    PT Next Visit Plan  check gaze and progress, add habituation sit<>sidelying, perform motion sensitivity quotient  components; assess multi-sensory balance    Consulted and Agree with Plan of Care  Patient       Patient will benefit from skilled therapeutic intervention in order to improve the following deficits and impairments:  Dizziness, Decreased balance(imbalance during episodes)  Visit Diagnosis: Dizziness and giddiness     Problem List Patient Active Problem List   Diagnosis Date Noted  . Labyrinthitis 05/07/2018  . Healthcare maintenance 05/07/2018  . Degenerative disc disease, cervical 05/07/2018  . Bilateral impacted cerumen 04/15/2018  . Left ear pain 04/15/2018  . Dizziness 04/15/2018  . Hair loss 12/20/2015  . Dysmenorrhea 09/27/2015  . Morbid obesity ([place]) 09/27/2015  . MALAISE AND FATIGUE 06/27/2011    Bebe Moncure, PT 10/18/2019, 12:54 PM  [facility] [place], [place], [address] Phone: [phone]   Fax:  [phone]  Name: Lashawne Rowlee MRN: [id number] Date of Birth: 06-17-1966

## 2019-10-18 NOTE — Patient Instructions (Signed)
Access Code: [email or web address]  URL: [email or web address]  Date: 10/18/2019  Prepared by: Rudell Cobb   Exercises Seated Gaze Stabilization with Head Rotation - 2-3x daily - 7x weekly Patient Education Gaze Stabilization

## 2019-10-27 ENCOUNTER — Ambulatory Visit (INDEPENDENT_AMBULATORY_CARE_PROVIDER_SITE_OTHER): Payer: Managed Care, Other (non HMO) | Admitting: Rehabilitative and Restorative Service Providers"

## 2019-10-27 ENCOUNTER — Other Ambulatory Visit: Payer: Self-pay

## 2019-10-27 ENCOUNTER — Encounter: Payer: Self-pay | Admitting: Rehabilitative and Restorative Service Providers"

## 2019-10-27 DIAGNOSIS — R42 Dizziness and giddiness: Secondary | ICD-10-CM

## 2019-10-27 NOTE — Patient Instructions (Signed)
Access Code: [email or web address]  URL: [email or web address]  Date: 10/27/2019  Prepared by: Rudell Cobb   Exercises Standing Gaze Stabilization with Head Rotation - 1 reps - 2 sets - 30-45 seconds hold - 3x daily - 7x weekly Tandem Stance - 3 reps - 1 sets - 30 seconds hold - 2x daily - 7x weekly Wide Stance with Head Rotation on Foam Pad - 5-10 reps - 1 sets                            - 2x daily - 7x weekly Wide Stance with Eyes Closed on Foam Pad - 3 reps - 1 sets - 30 seconds hold - 2x daily - 7x weekly

## 2019-10-27 NOTE — Therapy (Signed)
[place], [place], [address] Phone: [phone]   Fax:  [phone]  Physical Therapy Treatment  Patient Details  Name: Emily [name] MRN: [id number] Date of Birth: 06-27-1966 Referring Provider (PT): Abelino Derrick, MD   Encounter Date: 10/27/2019  PT End of Session - 10/27/19 2142    Visit Number  2    Number of Visits  4    Date for PT Re-Evaluation  11/17/19    Authorization Type  [employer]igna    PT Start Time  0851    PT Stop Time  0939    PT Time Calculation (min)  48 min       Past Medical History:  Diagnosis Date  . Chicken pox   . Colon polyp   . Genital warts   . GERD (gastroesophageal reflux disease)   . Meningitis spinal 2012    Past Surgical History:  Procedure Laterality Date  . BREAST SURGERY  '84  . CESAREAN SECTION  '92, '01   x's 2    There were no vitals filed for this visit.  Subjective Assessment - 10/27/19 0852    Subjective  The patient feels increased dizziness with walking into clinic and having to zigzag in between mat tables.  She is doing 1 min, 15 seconds of gaze adaptation exercises with mild increase in symtpoms.  She notes walking slower since dizziness episode.    Patient Stated Goals  Hoping to make vertigo resolve and know if there is anything to prevent these episodes.    Currently in Pain?  No/denies         [facility] PT Assessment - 10/27/19 0001      Functional Gait  Assessment   Gait assessed   Yes    Gait Level Surface  Walks 20 ft in less than 7 sec but greater than 5.5 sec, uses assistive device, slower speed, mild gait deviations, or deviates 6-10 in outside of the 12 in walkway width.   6.5 seconds   Change in Gait Speed  Able to smoothly change walking speed without loss of balance or gait deviation. Deviate no more than 6 in outside of the 12 in walkway width.    Gait with Horizontal Head Turns  Performs head turns smoothly with slight  change in gait velocity (eg, minor disruption to smooth gait path), deviates 6-10 in outside 12 in walkway width, or uses an assistive device.    Gait with Vertical Head Turns  Performs task with slight change in gait velocity (eg, minor disruption to smooth gait path), deviates 6 - 10 in outside 12 in walkway width or uses assistive device    Gait and Pivot Turn  Pivot turns safely within 3 sec and stops quickly with no loss of balance.    Step Over Obstacle  Is able to step over 2 stacked shoe boxes taped together (9 in total height) without changing gait speed. No evidence of imbalance.    Gait with Narrow Base of Support  Ambulates 4-7 steps.    Gait with Eyes Closed  Walks 20 ft, slow speed, abnormal gait pattern, evidence for imbalance, deviates 10-15 in outside 12 in walkway width. Requires more than 9 sec to ambulate 20 ft.    Ambulating Backwards  Walks 20 ft, uses assistive device, slower speed, mild gait deviations, deviates 6-10 in outside 12 in walkway width.    Steps  Alternating feet, no rail.    Total Score  22  FGA comment:  22/30         Vestibular Assessment - 10/27/19 0904      Positional Testing   Sidelying Test  Sidelying Right;Sidelying Left      Sidelying Right   Sidelying Right Duration  0    Sidelying Right Symptoms  No nystagmus      Sidelying Left   Sidelying Left Duration  0    Sidelying Left Symptoms  No nystagmus               [facility] Adult PT Treatment/Exercise - 10/27/19 2146      Self-Care   Self-Care  Other Self-Care Comments    Other Self-Care Comments   Education on nature of vestibular neuritis versus BPPV; provided handout for vestibular neuritis from [facility].  Spent time discussing prior onset of vertigo [place].      Neuro Re-ed    Neuro Re-ed Details   corner balance exercises of tandem stance for narrow base of support, foam standing with horizontal head motion, foam standing with wide base + eyes closed       Vestibular Treatment/Exercise - 10/27/19 0858      Vestibular Treatment/Exercise   Vestibular Treatment Provided  Habituation    Gaze Exercises  X1 Viewing Horizontal      X1 Viewing Horizontal   Foot Position  seated, progressed to standing with feet apart    Comments  provokes "minor" symptoms, performed for 30 seconds, nonstop encouraging faster speed and larger ROM (patient was dividing into R and L sides for HEP)            PT Education - 10/27/19 0924    Education Details  HEP update    Person(s) Educated  Patient    Methods  Explanation;Demonstration;Handout    Comprehension  Verbalized understanding;Returned demonstration          PT Long Term Goals - 10/27/19 2152      PT LONG TERM GOAL #1   Title  The patient will be independent with HEP for gaze adaptation, habituation, and general high level balance.    Time  4    Period  Weeks    Target Date  11/17/19      PT LONG TERM GOAL #2   Title  The patient will tolerate gaze x 1 viewing x 60 seconds to demo improved motion tolerance.    Time  4    Period  Weeks      PT LONG TERM GOAL #3   Title  The patient will be further assessed for multi-sensory balance challenges and goal to follow, if indicated.    Baseline  Patient assessed on foam with eyes closed and HEP components added to address.  FGA=22/30    Time  4    Period  Weeks    Status  Achieved            Plan - 10/27/19 2152    Clinical Impression Statement  The patient is tolerating more activity today with sit<>sidelying no longer provoking symptoms.  She was able to tolerate progression of gaze adaptation exercises from seated to standing for HEP and we added multi-sensory balance exercises to HEP.  PT encouraged return to home walking program.  Plan to f/u in 2 weeks to check progression of HEP.    PT Treatment/Interventions  ADLs/Self Care Home Management;Canalith Repostioning;Vestibular;Patient/family education;Balance training;Functional  mobility training;Neuromuscular re-education    PT Next Visit Plan  Check home exercise program,  provide education on self treatment of BPPV components of dizziness (negative currently for HEP)    Consulted and Agree with Plan of Care  Patient       Patient will benefit from skilled therapeutic intervention in order to improve the following deficits and impairments:  Dizziness, Decreased balance  Visit Diagnosis: Dizziness and giddiness     Problem List Patient Active Problem List   Diagnosis Date Noted  . Labyrinthitis 05/07/2018  . Healthcare maintenance 05/07/2018  . Degenerative disc disease, cervical 05/07/2018  . Bilateral impacted cerumen 04/15/2018  . Left ear pain 04/15/2018  . Dizziness 04/15/2018  . Hair loss 12/20/2015  . Dysmenorrhea 09/27/2015  . Morbid obesity ([place]) 09/27/2015  . MALAISE AND FATIGUE 06/27/2011    Roberto Romanoski, PT 10/27/2019, 9:57 PM  [facility] [place], [place], [address] Phone: [phone]   Fax:  [phone]  Name: Emily [name] MRN: [id number] Date of Birth: Feb 10, 1966

## 2019-11-03 ENCOUNTER — Encounter: Payer: Self-pay | Admitting: Family Medicine

## 2019-11-03 DIAGNOSIS — D126 Benign neoplasm of colon, unspecified: Secondary | ICD-10-CM | POA: Insufficient documentation

## 2019-11-10 ENCOUNTER — Encounter: Payer: Managed Care, Other (non HMO) | Admitting: Rehabilitative and Restorative Service Providers"

## 2020-01-09 ENCOUNTER — Encounter: Payer: Self-pay | Admitting: Rehabilitative and Restorative Service Providers"

## 2020-01-09 NOTE — Therapy (Signed)
[place], [place], [address] Phone: [phone]   Fax:  [phone]  Patient Details  Name: Emily [name] MRN: [id number] Date of Birth: 1966/06/22 Referring Provider:  Libby Maw MD  Encounter Date: last encounter 10/27/2019  PHYSICAL THERAPY DISCHARGE SUMMARY  Visits from Start of Care: 2  Current functional level related to goals / functional outcomes:   PT Long Term Goals - 10/27/19 2152      PT LONG TERM GOAL #1   Title  The patient will be independent with HEP for gaze adaptation, habituation, and general high level balance.    Time  4    Period  Weeks    Target Date  11/17/19      PT LONG TERM GOAL #2   Title  The patient will tolerate gaze x 1 viewing x 60 seconds to demo improved motion tolerance.    Time  4    Period  Weeks      PT LONG TERM GOAL #3   Title  The patient will be further assessed for multi-sensory balance challenges and goal to follow, if indicated.    Baseline  Patient assessed on foam with eyes closed and HEP components added to address.  FGA=22/30    Time  4    Period  Weeks    Status  Achieved      *Patient was seen for 2 visits and continued HEP independently.  Other goals not reassessed.   Remaining deficits: See eval + treatment note for patient status due to not returning.   Education / Equipment: Home program for vestibular rehabilitation established.  Plan: Patient agrees to discharge.  Patient goals were partially met. Patient is being discharged due to not returning since the last visit.  ?????     Thank you for the referral of this patient. Rudell Cobb, MPT   [name] 01/09/2020, 8:56 PM  [facility] [place], [place], [address] Phone: [phone]   Fax:  [phone]

## 2020-03-27 ENCOUNTER — Other Ambulatory Visit: Payer: Self-pay

## 2020-03-27 ENCOUNTER — Encounter: Payer: Self-pay | Admitting: Sports Medicine

## 2020-03-27 ENCOUNTER — Encounter: Payer: Managed Care, Other (non HMO) | Admitting: Family Medicine

## 2020-03-27 ENCOUNTER — Ambulatory Visit (INDEPENDENT_AMBULATORY_CARE_PROVIDER_SITE_OTHER): Payer: Managed Care, Other (non HMO) | Admitting: Sports Medicine

## 2020-03-27 ENCOUNTER — Ambulatory Visit (INDEPENDENT_AMBULATORY_CARE_PROVIDER_SITE_OTHER): Payer: Managed Care, Other (non HMO)

## 2020-03-27 DIAGNOSIS — M25572 Pain in left ankle and joints of left foot: Secondary | ICD-10-CM | POA: Diagnosis not present

## 2020-03-27 MED ORDER — ACETAMINOPHEN ER 650 MG PO TBCR: 650.0000 mg | EXTENDED_RELEASE_TABLET | Freq: Three times a day (TID) | ORAL | 3 refills | Status: DC | PRN

## 2020-03-27 MED ORDER — MELOXICAM 15 MG PO TABS: ORAL_TABLET | ORAL | 3 refills | Status: DC

## 2020-03-27 NOTE — Progress Notes (Deleted)
  Emily [name] - 54 y.o. female MRN [id number]  Date of birth: [birth year]  SUBJECTIVE:  Including CC & ROS.  No chief complaint on file.   Emily [name] is a 54 y.o. female that is  ***.  ***   Review of Systems See HPI   HISTORY: Past Medical, Surgical, Social, and Family History Reviewed & Updated per EMR.   Pertinent Historical Findings include:  Past Medical History:  Diagnosis Date  . Chicken pox   . Colon polyp   . Genital warts   . GERD (gastroesophageal reflux disease)   . Meningitis spinal 2012  . Overactive bladder     Past Surgical History:  Procedure Laterality Date  . BREAST SURGERY  '84  . CESAREAN SECTION  '92, '01   x's 2    No family history on file.  Social History   Socioeconomic History  . Marital status: Married    Spouse name: Not on file  . Number of children: Not on file  . Years of education: Not on file  . 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 Smoking status: Never Smoker  . Smokeless tobacco: Never Used  Substance and Sexual Activity  . Alcohol use: Not Currently    Alcohol/week: 0.0 standard drinks  . Drug use: No  . Sexual activity: Yes    Partners: Male  Other Topics Concern  . Not on file  Social History Narrative   Exercise-- no   Social Determinants of Health   Financial Resource Strain:   . Difficulty of Paying Living Expenses:   Food Insecurity:   . Worried About Charity fundraiser in the Last Year:   . Arboriculturist in the Last Year:   Transportation Needs:   . Film/video editor (Medical):   Marland Kitchen Lack of Transportation (Non-Medical):   Physical Activity:   . Days of Exercise per Week:   . Minutes of Exercise per Session:   Stress:   . Feeling of Stress :   Social Connections:   . Frequency of Communication with Friends and Family:   . Frequency of Social Gatherings with Friends and Family:   . Attends Religious Services:   . Active Member  of Clubs or Organizations:   . Attends Archivist Meetings:   Marland Kitchen Marital Status:   Intimate Partner Violence:   . Fear of Current or Ex-Partner:   . Emotionally Abused:   Marland Kitchen Physically Abused:   . Sexually Abused:      PHYSICAL EXAM:  VS: LMP 03/29/2016  Physical Exam Gen: NAD, alert, cooperative with exam, well-appearing MSK:  ***      ASSESSMENT & PLAN:   No problem-specific Assessment & Plan notes found for this encounter.

## 2020-03-27 NOTE — Assessment & Plan Note (Signed)
This pleasant 54 year old female has had relatively chronic pain in her left foot and ankle, she has tenderness over her metatarsal shafts, mid tarsal joints, as well as tibiotalar joint. 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 She did get some good supportive shoes but pain is persistent with gelling. This is all consistent with foot and ankle osteoarthritis. Adding meloxicam, arthritis strength Tylenol, x-rays of the foot and ankle, referral for custom molded orthotics. Return to see me in 4 weeks, we will consider an MRI and boot immobilization if no better. (She has a boot at home)

## 2020-03-27 NOTE — Progress Notes (Signed)
    Procedures performed today:    None.  Independent interpretation of notes and tests performed by another provider:   None.  Brief History, Exam, Impression, and Recommendations:    Pain in left ankle and joints of left foot This pleasant 54 year old female has had relatively chronic pain in her left foot and ankle, she has tenderness over her metatarsal shafts, mid tarsal joints, as well as tibiotalar joint. 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 She did get some good supportive shoes but pain is persistent with gelling. This is all consistent with foot and ankle osteoarthritis. Adding meloxicam, arthritis strength Tylenol, x-rays of the foot and ankle, referral for custom molded orthotics. Return to see me in 4 weeks, we will consider an MRI and boot immobilization if no better. (She has a boot at home)    ___________________________________________ Gwen Her. Dianah Field, M.D., ABFM., CAQSM. Primary Care and [place] Instructor of [place] of [facility] of [facility]

## 2020-04-24 ENCOUNTER — Ambulatory Visit: Payer: Managed Care, Other (non HMO) | Admitting: Sports Medicine

## 2020-04-26 ENCOUNTER — Telehealth (INDEPENDENT_AMBULATORY_CARE_PROVIDER_SITE_OTHER): Payer: Managed Care, Other (non HMO) | Admitting: Sports Medicine

## 2020-04-26 DIAGNOSIS — M25572 Pain in left ankle and joints of left foot: Secondary | ICD-10-CM

## 2020-04-26 MED ORDER — CELECOXIB 200 MG PO CAPS: ORAL_CAPSULE | ORAL | 2 refills | Status: DC

## 2020-04-26 NOTE — Assessment & Plan Note (Signed)
This pleasant 54 year old female returns, she had relatively chronic pain in her left foot and ankle related to work. 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 There was also significant swelling in the foot and ankle. This was consistent with foot and ankle osteoarthritis the stress injury was in the differential. X-rays did show some soft tissue swelling and mild osteoarthritis, meloxicam improved her symptoms considerably but she did have significant dyspepsia and has stopped it. We will be switching to Celebrex. She never got her orthotics as she was concerned about cost. I have asked her to check with her insurance company about coverage. She continues to have some swelling but essentially no pain, and also endorses itching over her left foot and ankle. I did offer MRI +/- DVT ultrasound (her Bevelyn Buckles' sign was negative at the initial visit) and she plans to check with her husband to see about coverage and if she can do this. She can contact me on an as-needed basis.

## 2020-04-26 NOTE — Progress Notes (Signed)
   Virtual Visit via WebEx/MyChart   I connected with  Tarini Muston  on 04/26/20 via WebEx/MyChart/Doximity Video and verified that I am speaking with the correct person using two identifiers.   I discussed the limitations, risks, security and privacy concerns of performing an evaluation and management service by WebEx/MyChart/Doximity Video, including the higher likelihood of inaccurate diagnosis and treatment, and the availability of in person appointments.  We also discussed the likely need of an additional face to face encounter for complete and high quality delivery of care.  I also discussed with the patient that there may be a patient responsible charge related to this service. The patient expressed understanding and wishes to proceed.  Provider location is either at home or medical facility. Patient location is at their home, different from provider location. People involved in care of the patient during this telehealth encounter were myself, my nurse/medical assistant, and my front office/scheduling team member.  Review of Systems: No fevers, chills, night sweats, weight loss, chest pain, or shortness of breath.   Objective Findings:    General: Speaking full sentences, no audible heavy breathing.  Sounds alert and appropriately interactive.  Appears well.  Face symmetric.  Extraocular movements intact.  Pupils equal and round.  No nasal flaring or accessory muscle use visualized.  Independent interpretation of tests performed by another provider:   None.  Brief History, Exam, Impression, and Recommendations:    Pain in left ankle and joints of left foot This pleasant 54 year old female returns, she had relatively chronic pain in her left foot and ankle related to work. 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 There was also significant swelling in the foot and ankle. This was  consistent with foot and ankle osteoarthritis the stress injury was in the differential. X-rays did show some soft tissue swelling and mild osteoarthritis, meloxicam improved her symptoms considerably but she did have significant dyspepsia and has stopped it. We will be switching to Celebrex. She never got her orthotics as she was concerned about cost. I have asked her to check with her insurance company about coverage. She continues to have some swelling but essentially no pain, and also endorses itching over her left foot and ankle. I did offer MRI +/- DVT ultrasound (her Bevelyn Buckles' sign was negative at the initial visit) and she plans to check with her husband to see about coverage and if she can do this. She can contact me on an as-needed basis.   I discussed the above assessment and treatment plan with the patient. The patient was provided an opportunity to ask questions and all were answered. The patient agreed with the plan and demonstrated an understanding of the instructions.   The patient was advised to call back or seek an in-person evaluation if the symptoms worsen or if the condition fails to improve as anticipated.   I provided 30 minutes of face to face and non-face-to-face time during this encounter date, time was needed to gather information, review chart, records, communicate/coordinate with staff remotely, as well as complete documentation.   ___________________________________________ Gwen Her. Dianah Field, M.D., ABFM., CAQSM. Primary Care and [place] Instructor of [place] [facility]

## 2020-05-08 ENCOUNTER — Other Ambulatory Visit: Payer: Self-pay

## 2020-05-09 ENCOUNTER — Encounter: Payer: Self-pay | Admitting: Family Medicine

## 2020-05-09 ENCOUNTER — Ambulatory Visit: Payer: Managed Care, Other (non HMO) | Admitting: Family Medicine

## 2020-05-09 VITALS — BP 142/90 | HR 76 | Temp 96.6°F | Ht 66.0 in | Wt 257.6 lb

## 2020-05-09 DIAGNOSIS — Z6841 Body Mass Index (BMI) 40.0 and over, adult: Secondary | ICD-10-CM | POA: Diagnosis not present

## 2020-05-09 DIAGNOSIS — Z0000 Encounter for general adult medical examination without abnormal findings: Secondary | ICD-10-CM

## 2020-05-09 DIAGNOSIS — R2242 Localized swelling, mass and lump, left lower limb: Secondary | ICD-10-CM

## 2020-05-09 DIAGNOSIS — I10 Essential (primary) hypertension: Secondary | ICD-10-CM | POA: Insufficient documentation

## 2020-05-09 MED ORDER — CHLORTHALIDONE 25 MG PO TABS: 25.0000 mg | ORAL_TABLET | Freq: Every day | ORAL | 2 refills | Status: DC

## 2020-05-09 NOTE — Progress Notes (Signed)
Established Patient Office Visit  Subjective:  Patient ID: Emily [name], female    DOB: 06-Mar-1966  Age: 54 y.o. MRN: [id number]  CC:  Chief Complaint  Patient presents with  . Edema    C/O swelling in feet come and go, painful left rash on lower legs symptoms x 1 year.     HPI Emily [name] presents for follow-up of swelling in her left leg.  She is seeing sports medicine and they have recommended inserts.  It is uncertain whether or not insurance will pay for these for her.  There is swelling in the left leg that is greater than the right.  It is worse after she works her part-time job which requires a great deal of walking.  She has no history of DVT.  Denies chest pain shortness of breath.  Status post recent Pap pelvic and colonoscopies.  Weight is increased and unfortunately still has her blood pressure.  Past Medical History:  Diagnosis Date  . Chicken pox   . Colon polyp   . Genital warts   . GERD (gastroesophageal reflux disease)   . Meningitis spinal 2012  . Overactive bladder     Past Surgical History:  Procedure Laterality Date  . BREAST SURGERY  '84  . CESAREAN SECTION  '92, '01   x's 2    History reviewed. No pertinent family history.  Social History   Socioeconomic History  . Marital status: Married    Spouse name: Not on file  . Number of children: Not on file  . Years of education: Not on file  . 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 Smoking status: Never Smoker  . Smokeless tobacco: Never Used  Substance and Sexual Activity  . Alcohol use: Not Currently    Alcohol/week: 0.0 standard drinks  . Drug use: No  . Sexual activity: Yes    Partners: Male  Other Topics Concern  . Not on file  Social History Narrative   Exercise-- no   Social Determinants of Health   Financial Resource Strain:   . Difficulty of Paying Living Expenses:   Food Insecurity:   . Worried About Ship broker in the Last Year:   . Arboriculturist in the Last Year:   Transportation Needs:   . Film/video editor (Medical):   Marland Kitchen Lack of Transportation (Non-Medical):   Physical Activity:   . Days of Exercise per Week:   . Minutes of Exercise per Session:   Stress:   . Feeling of Stress :   Social Connections:   . Frequency of Communication with Friends and Family:   . Frequency of Social Gatherings with Friends and Family:   . Attends Religious Services:   . Active Member of Clubs or Organizations:   . Attends Archivist Meetings:   Marland Kitchen Marital Status:   Intimate Partner Violence:   . Fear of Current or Ex-Partner:   . Emotionally Abused:   Marland Kitchen Physically Abused:   . Sexually Abused:     Outpatient Medications Prior to Visit  Medication Sig Dispense Refill  . acetaminophen (TYLENOL) 650 MG CR tablet Take 1 tablet (650 mg total) by mouth every 8 (eight) hours as needed for pain. 90 tablet 3  . celecoxib (CELEBREX) 200 MG capsule One to 2 tablets by mouth daily as needed for pain. 60 capsule 2  . MYRBETRIQ 50 MG TB24 tablet  Take 50 mg by mouth daily.    . Mirabegron (MYRBETRIQ PO) Take by mouth.     No facility-administered medications prior to visit.    Allergies  Allergen Reactions  . Other Other (See Comments)    Red meat- abdominal pain    ROS Review of Systems  Constitutional: Negative.   HENT: Negative.   Eyes: Negative for photophobia and visual disturbance.  Respiratory: Negative.  Negative for chest tightness, shortness of breath and wheezing.   Cardiovascular: Positive for leg swelling. Negative for chest pain and palpitations.  Gastrointestinal: Negative.   Endocrine: Negative for polyphagia and polyuria.  Genitourinary: Negative.   Musculoskeletal: Negative for gait problem and joint swelling.  Allergic/Immunologic: Negative for immunocompromised state.  Neurological: Negative for speech difficulty and light-headedness.  Hematological: Does not  bruise/bleed easily.  Psychiatric/Behavioral: Negative.       Objective:    Physical Exam  Constitutional: She is oriented to person, place, and time. She appears well-developed and well-nourished. No distress.  HENT:  Head: Normocephalic and atraumatic.  Right Ear: External ear normal.  Left Ear: External ear normal.  Mouth/Throat: Oropharynx is clear and moist.  Eyes: Conjunctivae are normal. Right eye exhibits no discharge. Left eye exhibits no discharge. No scleral icterus.  Neck: No JVD present. No tracheal deviation present. No thyromegaly present.  Cardiovascular: Normal rate, regular rhythm and normal heart sounds.  Pulses:      Dorsalis pedis pulses are 1+ on the left side.       Posterior tibial pulses are 1+ on the left side.  Pulmonary/Chest: Effort normal and breath sounds normal. No stridor.  Abdominal: Bowel sounds are normal.  Musculoskeletal:        General: No edema.     Left lower leg: Swelling present. No tenderness. No edema.  Lymphadenopathy:    She has no cervical adenopathy.  Neurological: She is alert and oriented to person, place, and time.  Skin: Skin is warm and dry. She is not diaphoretic.  Psychiatric: She has a normal mood and affect. Her behavior is normal.    BP (!) 142/90   Pulse 76   Temp (!) 96.6 F (35.9 C) (Tympanic)   Ht 5\' 6"  (1.676 m)   Wt 257 lb 9.6 oz (116.8 kg)   LMP 03/29/2016   SpO2 96%   BMI 41.58 kg/m  Wt Readings from Last 3 Encounters:  05/09/20 257 lb 9.6 oz (116.8 kg)  05/07/18 240 lb 6 oz (109 kg)  04/15/18 236 lb 6.4 oz (107.2 kg)     Health Maintenance Due  Topic Date Due  . HIV Screening  Never done  . COVID-19 Vaccine (1) Never done    There are no preventive care reminders to display for this patient.  Lab Results  Component Value Date   TSH 1.70 04/15/2018   Lab Results  Component Value Date   WBC 5.1 04/15/2018   HGB 13.8 04/15/2018   HCT 41.2 04/15/2018   MCV 83.9 04/15/2018   PLT 270.0  04/15/2018   Lab Results  Component Value Date   NA 138 04/15/2018   K 3.9 04/15/2018   CO2 30 04/15/2018   GLUCOSE 85 04/15/2018   BUN 13 04/15/2018   CREATININE 0.79 04/15/2018   BILITOT 0.3 04/15/2018   ALKPHOS 81 04/15/2018   AST 15 04/15/2018   ALT 13 04/15/2018   PROT 6.7 04/15/2018   ALBUMIN 3.9 04/15/2018   CALCIUM 9.5 04/15/2018   GFR 80.57 04/15/2018  Lab Results  Component Value Date   CHOL 207 (H) 09/27/2015   Lab Results  Component Value Date   HDL 48.70 09/27/2015   Lab Results  Component Value Date   LDLCALC 124 (H) 09/27/2015   Lab Results  Component Value Date   TRIG 167.0 (H) 09/27/2015   Lab Results  Component Value Date   CHOLHDL 4 09/27/2015   No results found for: HGBA1C    Assessment & Plan:   Problem List Items Addressed This Visit      Cardiovascular and Mediastinum   Essential hypertension - Primary   Relevant Medications   chlorthalidone (HYGROTON) 25 MG tablet   Other Relevant Orders   CBC   Comprehensive metabolic panel   Urinalysis, Routine w reflex microscopic     Other   Class 3 severe obesity due to excess calories with body mass index (BMI) of 40.0 to 44.9 in adult [facility])   Relevant Orders   Amb Ref to Medical Weight Management   VITAMIN D 25 Hydroxy (Vit-D Deficiency, Fractures)   Healthcare maintenance   Relevant Orders   CBC   Comprehensive metabolic panel   LDL cholesterol, direct   Lipid panel   Localized swelling of left lower leg   Relevant Medications   chlorthalidone (HYGROTON) 25 MG tablet   Other Relevant Orders   D-Dimer, Quantitative      Meds ordered this encounter  Medications  . chlorthalidone (HYGROTON) 25 MG tablet    Sig: Take 1 tablet (25 mg total) by mouth daily.    Dispense:  30 tablet    Refill:  2    Follow-up: Return in about 1 month (around 06/09/2020).   Patient was given information on BMI, hypertension and managing hypertension.  She knows that with weight loss her blood  pressure is likely to come down and obviate her need for treatment.  She agrees to give weight loss management try. Libby Maw, MD

## 2020-05-09 NOTE — Patient Instructions (Addendum)
BMI for Adults What is BMI? Body mass index (BMI) is a number that is calculated from a person's weight and height. BMI can help estimate how much of a person's weight is composed of fat. BMI does not measure body fat directly. Rather, it is an alternative to procedures that directly measure body fat, which can be difficult and expensive. BMI can help identify people who may be at higher risk for certain medical problems. What are BMI measurements used for? BMI is used as a screening tool to identify possible weight problems. It helps determine whether a person is obese, overweight, a healthy weight, or underweight. BMI is useful for:  Identifying a weight problem that may be related to a medical condition or may increase the risk for medical problems.  Promoting changes, such as changes in diet and exercise, to help reach a healthy weight. BMI screening can be repeated to see if these changes are working. How is BMI calculated? BMI involves measuring your weight in relation to your height. Both height and weight are measured, and the BMI is calculated from those numbers. This can be done either in [place] ([place] or metric measurements. Note that charts and online BMI calculators are available to help you find your BMI quickly and easily without having to do these calculations yourself. To calculate your BMI in [place] ([place] measurements:  1. Measure your weight in pounds (lb). 2. Multiply the number of pounds by 703. ? For example, for a person who weighs 180 lb, multiply that number by 703, which equals 126,540. 3. Measure your height in inches. Then multiply that number by itself to get a measurement called "inches squared." ? For example, for a person who is 70 inches tall, the "inches squared" measurement is 70 inches x 70 inches, which equals 4,900 inches squared. 4. Divide the total from step 2 (number of lb x 703) by the total from step 3 (inches squared): 126,540  4,900 = 25.8. This is  your BMI. To calculate your BMI in metric measurements: 1. Measure your weight in kilograms (kg). 2. Measure your height in meters (m). Then multiply that number by itself to get a measurement called "meters squared." ? For example, for a person who is 1.75 m tall, the "meters squared" measurement is 1.75 m x 1.75 m, which is equal to 3.1 meters squared. 3. Divide the number of kilograms (your weight) by the meters squared number. In this example: 70  3.1 = 22.6. This is your BMI. What do the results mean? BMI charts are used to identify whether you are underweight, normal weight, overweight, or obese. The following guidelines will be used:  Underweight: BMI less than 18.5.  Normal weight: BMI between 18.5 and 24.9.  Overweight: BMI between 25 and 29.9.  Obese: BMI of 30 or above. Keep these notes in mind:  Weight includes both fat and muscle, so someone with a muscular build, such as an athlete, may have a BMI that is higher than 24.9. In cases like these, BMI is not an accurate measure of body fat.  To determine if excess body fat is the cause of a BMI of 25 or higher, further assessments may need to be done by a health care provider.  BMI is usually interpreted in the same way for men and women. Where to find more information For more information about BMI, including tools to quickly calculate your BMI, go to these websites:  Centers for Disease Control and Prevention: http://www.wolf.info/  American  Heart Association: www.heart.org  National Heart, Lung, and Blood Institute: https://wilson-eaton.com/ Summary  Body mass index (BMI) is a number that is calculated from a person's weight and height.  BMI may help estimate how much of a person's weight is composed of fat. BMI can help identify those who may be at higher risk for certain medical problems.  BMI can be measured using English measurements or metric measurements.  BMI charts are used to identify whether you are underweight, normal  weight, overweight, or obese. This information is not intended to replace advice given to you by your health care provider. Make sure you discuss any questions you have with your health care provider. Document Revised: 09/07/2019 Document Reviewed: 07/15/2019 Elsevier Patient Education  [place].  Hypertension, Adult High blood pressure (hypertension) is when the force of blood pumping through the arteries is too strong. The arteries are the blood vessels that carry blood from the heart throughout the body. Hypertension forces the heart to work harder to pump blood and may cause arteries to become narrow or stiff. Untreated or uncontrolled hypertension can cause a heart attack, heart failure, a stroke, kidney disease, and other problems. A blood pressure reading consists of a higher number over a lower number. Ideally, your blood pressure should be below 120/80. The first ("top") number is called the systolic pressure. It is a measure of the pressure in your arteries as your heart beats. The second ("bottom") number is called the diastolic pressure. It is a measure of the pressure in your arteries as the heart relaxes. What are the causes? The exact cause of this condition is not known. There are some conditions that result in or are related to high blood pressure. What increases the risk? Some risk factors for high blood pressure are under your control. The following factors may make you more likely to develop this condition:  Smoking.  Having type 2 diabetes mellitus, high cholesterol, or both.  Not getting enough exercise or physical activity.  Being overweight.  Having too much fat, sugar, calories, or salt (sodium) in your diet.  Drinking too much alcohol. Some risk factors for high blood pressure may be difficult or impossible to change. Some of these factors include:  Having chronic kidney disease.  Having a family history of high blood pressure.  Age. Risk increases  with age.  Race. You may be at higher risk if you are African American.  Gender. Men are at higher risk than women before age 79. After age 54, women are at higher risk than men.  Having obstructive sleep apnea.  Stress. What are the signs or symptoms? High blood pressure may not cause symptoms. Very high blood pressure (hypertensive crisis) may cause:  Headache.  Anxiety.  Shortness of breath.  Nosebleed.  Nausea and vomiting.  Vision changes.  Severe chest pain.  Seizures. How is this diagnosed? This condition is diagnosed by measuring your blood pressure while you are seated, with your arm resting on a flat surface, your legs uncrossed, and your feet flat on the floor. The cuff of the blood pressure monitor will be placed directly against the skin of your upper arm at the level of your heart. It should be measured at least twice using the same arm. Certain conditions can cause a difference in blood pressure between your right and left arms. Certain factors can cause blood pressure readings to be lower or higher than normal for a short period of time:  When your blood pressure is  higher when you are in a health care provider's office than when you are at home, this is called white coat hypertension. Most people with this condition do not need medicines.  When your blood pressure is higher at home than when you are in a health care provider's office, this is called masked hypertension. Most people with this condition may need medicines to control blood pressure. If you have a high blood pressure reading during one visit or you have normal blood pressure with other risk factors, you may be asked to:  Return on a different day to have your blood pressure checked again.  Monitor your blood pressure at home for 1 week or longer. If you are diagnosed with hypertension, you may have other blood or imaging tests to help your health care provider understand your overall risk for other  conditions. How is this treated? This condition is treated by making healthy lifestyle changes, such as eating healthy foods, exercising more, and reducing your alcohol intake. Your health care provider may prescribe medicine if lifestyle changes are not enough to get your blood pressure under control, and if:  Your systolic blood pressure is above 130.  Your diastolic blood pressure is above 80. Your personal target blood pressure may vary depending on your medical conditions, your age, and other factors. Follow these instructions at home: Eating and drinking   Eat a diet that is high in fiber and potassium, and low in sodium, added sugar, and fat. An example eating plan is called the DASH (Dietary Approaches to Stop Hypertension) diet. To eat this way: ? Eat plenty of fresh fruits and vegetables. Try to fill one half of your plate at each meal with fruits and vegetables. ? Eat whole grains, such as whole-wheat pasta, brown rice, or whole-grain bread. Fill about one fourth of your plate with whole grains. ? Eat or drink low-fat dairy products, such as skim milk or low-fat yogurt. ? Avoid fatty cuts of meat, processed or cured meats, and poultry with skin. Fill about one fourth of your plate with lean proteins, such as fish, chicken without skin, beans, eggs, or tofu. ? Avoid pre-made and processed foods. These tend to be higher in sodium, added sugar, and fat.  Reduce your daily sodium intake. Most people with hypertension should eat less than 1,500 mg of sodium a day.  Do not drink alcohol if: ? Your health care provider tells you not to drink. ? You are pregnant, may be pregnant, or are planning to become pregnant.  If you drink alcohol: ? Limit how much you use to:  0-1 drink a day for women.  0-2 drinks a day for men. ? Be aware of how much alcohol is in your drink. In the [place] one drink equals one 12 oz bottle of beer (355 mL), one 5 oz glass of wine (148 mL), or one 1 oz glass  of hard liquor (44 mL). Lifestyle   Work with your health care provider to maintain a healthy body weight or to lose weight. Ask what an ideal weight is for you.  Get at least 30 minutes of exercise most days of the week. Activities may include walking, swimming, or biking.  Include exercise to strengthen your muscles (resistance exercise), such as Pilates or lifting weights, as part of your weekly exercise routine. Try to do these types of exercises for 30 minutes at least 3 days a week.  Do not use any products that contain nicotine or tobacco, such as  cigarettes, e-cigarettes, and chewing tobacco. If you need help quitting, ask your health care provider.  Monitor your blood pressure at home as told by your health care provider.  Keep all follow-up visits as told by your health care provider. This is important. Medicines  Take over-the-counter and prescription medicines only as told by your health care provider. Follow directions carefully. Blood pressure medicines must be taken as prescribed.  Do not skip doses of blood pressure medicine. Doing this puts you at risk for problems and can make the medicine less effective.  Ask your health care provider about side effects or reactions to medicines that you should watch for. Contact a health care provider if you:  Think you are having a reaction to a medicine you are taking.  Have headaches that keep coming back (recurring).  Feel dizzy.  Have swelling in your ankles.  Have trouble with your vision. Get help right away if you:  Develop a severe headache or confusion.  Have unusual weakness or numbness.  Feel faint.  Have severe pain in your chest or abdomen.  Vomit repeatedly.  Have trouble breathing. Summary  Hypertension is when the force of blood pumping through your arteries is too strong. If this condition is not controlled, it may put you at risk for serious complications.  Your personal target blood pressure  may vary depending on your medical conditions, your age, and other factors. For most people, a normal blood pressure is less than 120/80.  Hypertension is treated with lifestyle changes, medicines, or a combination of both. Lifestyle changes include losing weight, eating a healthy, low-sodium diet, exercising more, and limiting alcohol. This information is not intended to replace advice given to you by your health care provider. Make sure you discuss any questions you have with your health care provider. Document Revised: 08/25/2018 Document Reviewed: 08/25/2018 Elsevier Patient Education  2020 [place] Your Hypertension Hypertension is commonly called high blood pressure. This is when the force of your blood pressing against the walls of your arteries is too strong. Arteries are blood vessels that carry blood from your heart throughout your body. Hypertension forces the heart to work harder to pump blood, and may cause the arteries to become narrow or stiff. Having untreated or uncontrolled hypertension can cause heart attack, stroke, kidney disease, and other problems. What are blood pressure readings? A blood pressure reading consists of a higher number over a lower number. Ideally, your blood pressure should be below 120/80. The first ("top") number is called the systolic pressure. It is a measure of the pressure in your arteries as your heart beats. The second ("bottom") number is called the diastolic pressure. It is a measure of the pressure in your arteries as the heart relaxes. What does my blood pressure reading mean? Blood pressure is classified into four stages. Based on your blood pressure reading, your health care provider may use the following stages to determine what type of treatment you need, if any. Systolic pressure and diastolic pressure are measured in a unit called mm Hg. Normal  Systolic pressure: below 123456.  Diastolic pressure: below 80. Elevated  Systolic  pressure: [id number].  Diastolic pressure: below 80. Hypertension stage 1  Systolic pressure: 0000000.  Diastolic pressure: [id number]. Hypertension stage 2  Systolic pressure: [id number] or above.  Diastolic pressure: 90 or above. What health risks are associated with hypertension? Managing your hypertension is an important responsibility. Uncontrolled hypertension can lead to:  A heart attack.  A stroke.  A weakened blood vessel (aneurysm).  Heart failure.  Kidney damage.  Eye damage.  Metabolic syndrome.  Memory and concentration problems. What changes can I make to manage my hypertension? Hypertension can be managed by making lifestyle changes and possibly by taking medicines. Your health care provider will help you make a plan to bring your blood pressure within a normal range. Eating and drinking   Eat a diet that is high in fiber and potassium, and low in salt (sodium), added sugar, and fat. An example eating plan is called the DASH (Dietary Approaches to Stop Hypertension) diet. To eat this way: ? Eat plenty of fresh fruits and vegetables. Try to fill half of your plate at each meal with fruits and vegetables. ? Eat whole grains, such as whole wheat pasta, brown rice, or whole grain bread. Fill about one quarter of your plate with whole grains. ? Eat low-fat diary products. ? Avoid fatty cuts of meat, processed or cured meats, and poultry with skin. Fill about one quarter of your plate with lean proteins such as fish, chicken without skin, beans, eggs, and tofu. ? Avoid premade and processed foods. These tend to be higher in sodium, added sugar, and fat.  Reduce your daily sodium intake. Most people with hypertension should eat less than 1,500 mg of sodium a day.  Limit alcohol intake to no more than 1 drink a day for nonpregnant women and 2 drinks a day for men. One drink equals 12 oz of beer, 5 oz of wine, or 1 oz of hard liquor. Lifestyle  Work with your health care  provider to maintain a healthy body weight, or to lose weight. Ask what an ideal weight is for you.  Get at least 30 minutes of exercise that causes your heart to beat faster (aerobic exercise) most days of the week. Activities may include walking, swimming, or biking.  Include exercise to strengthen your muscles (resistance exercise), such as weight lifting, as part of your weekly exercise routine. Try to do these types of exercises for 30 minutes at least 3 days a week.  Do not use any products that contain nicotine or tobacco, such as cigarettes and e-cigarettes. If you need help quitting, ask your health care provider.  Control any long-term (chronic) conditions you have, such as high cholesterol or diabetes. Monitoring  Monitor your blood pressure at home as told by your health care provider. Your personal target blood pressure may vary depending on your medical conditions, your age, and other factors.  Have your blood pressure checked regularly, as often as told by your health care provider. Working with your health care provider  Review all the medicines you take with your health care provider because there may be side effects or interactions.  Talk with your health care provider about your diet, exercise habits, and other lifestyle factors that may be contributing to hypertension.  Visit your health care provider regularly. Your health care provider can help you create and adjust your plan for managing hypertension. Will I need medicine to control my blood pressure? Your health care provider may prescribe medicine if lifestyle changes are not enough to get your blood pressure under control, and if:  Your systolic blood pressure is 130 or higher.  Your diastolic blood pressure is 80 or higher. Take medicines only as told by your health care provider. Follow the directions carefully. Blood pressure medicines must be taken as prescribed. The medicine does not work as well when you  skip doses.  Skipping doses also puts you at risk for problems. Contact a health care provider if:  You think you are having a reaction to medicines you have taken.  You have repeated (recurrent) headaches.  You feel dizzy.  You have swelling in your ankles.  You have trouble with your vision. Get help right away if:  You develop a severe headache or confusion.  You have unusual weakness or numbness, or you feel faint.  You have severe pain in your chest or abdomen.  You vomit repeatedly.  You have trouble breathing. Summary  Hypertension is when the force of blood pumping through your arteries is too strong. If this condition is not controlled, it may put you at risk for serious complications.  Your personal target blood pressure may vary depending on your medical conditions, your age, and other factors. For most people, a normal blood pressure is less than 120/80.  Hypertension is managed by lifestyle changes, medicines, or both. Lifestyle changes include weight loss, eating a healthy, low-sodium diet, exercising more, and limiting alcohol. This information is not intended to replace advice given to you by your health care provider. Make sure you discuss any questions you have with your health care provider. Document Revised: 04/08/2019 Document Reviewed: 11/12/2016 Elsevier Patient Education  [place].

## 2020-05-10 ENCOUNTER — Other Ambulatory Visit: Payer: Self-pay

## 2020-05-11 ENCOUNTER — Other Ambulatory Visit (INDEPENDENT_AMBULATORY_CARE_PROVIDER_SITE_OTHER): Payer: Managed Care, Other (non HMO)

## 2020-05-11 DIAGNOSIS — Z0000 Encounter for general adult medical examination without abnormal findings: Secondary | ICD-10-CM | POA: Diagnosis not present

## 2020-05-11 DIAGNOSIS — I10 Essential (primary) hypertension: Secondary | ICD-10-CM | POA: Diagnosis not present

## 2020-05-11 DIAGNOSIS — Z6841 Body Mass Index (BMI) 40.0 and over, adult: Secondary | ICD-10-CM

## 2020-05-11 DIAGNOSIS — R2242 Localized swelling, mass and lump, left lower limb: Secondary | ICD-10-CM

## 2020-05-11 LAB — URINALYSIS, ROUTINE W REFLEX MICROSCOPIC
Bilirubin Urine: NEGATIVE
Hgb urine dipstick: NEGATIVE
Ketones, ur: NEGATIVE
Leukocytes,Ua: NEGATIVE
Nitrite: NEGATIVE
RBC / HPF: NONE SEEN (ref 0–?)
Specific Gravity, Urine: 1.02 (ref 1.000–1.030)
Total Protein, Urine: NEGATIVE
Urine Glucose: NEGATIVE
Urobilinogen, UA: 0.2 (ref 0.0–1.0)
WBC, UA: NONE SEEN (ref 0–?)
pH: 7.5 (ref 5.0–8.0)

## 2020-05-11 LAB — COMPREHENSIVE METABOLIC PANEL
ALT: 15 U/L (ref 0–35)
AST: 17 U/L (ref 0–37)
Albumin: 3.9 g/dL (ref 3.5–5.2)
Alkaline Phosphatase: 74 U/L (ref 39–117)
BUN: 14 mg/dL (ref 6–23)
CO2: 28 mEq/L (ref 19–32)
Calcium: 8.7 mg/dL (ref 8.4–10.5)
Chloride: 105 mEq/L (ref 96–112)
Creatinine, Ser: 0.79 mg/dL (ref 0.40–1.20)
GFR: 75.23 mL/min (ref >60.00)
Glucose, Bld: 92 mg/dL (ref 70–99)
Potassium: 3.7 mEq/L (ref 3.5–5.1)
Sodium: 139 mEq/L (ref 135–145)
Total Bilirubin: 0.3 mg/dL (ref 0.2–1.2)
Total Protein: 6 g/dL (ref 6.0–8.3)

## 2020-05-11 LAB — CBC
HCT: 39.8 % (ref 36.0–46.0)
Hemoglobin: 13.3 g/dL (ref 12.0–15.0)
MCHC: 33.3 g/dL (ref 30.0–36.0)
MCV: 84 fl (ref 78.0–100.0)
Platelets: 238 10*3/uL (ref 150.0–400.0)
RBC: 4.74 Mil/uL (ref 3.87–5.11)
RDW: 14 % (ref 11.5–15.5)
WBC: 4.2 10*3/uL (ref 4.0–10.5)

## 2020-05-11 LAB — LIPID PANEL
Cholesterol: 233 mg/dL — ABNORMAL HIGH (ref 0–200)
HDL: 52.1 mg/dL (ref >39.00)
LDL Cholesterol: 153 mg/dL — ABNORMAL HIGH (ref 0–99)
NonHDL: 180.53
Total CHOL/HDL Ratio: 4
Triglycerides: 139 mg/dL (ref 0.0–149.0)
VLDL: 27.8 mg/dL (ref 0.0–40.0)

## 2020-05-11 LAB — VITAMIN D 25 HYDROXY (VIT D DEFICIENCY, FRACTURES): VITD: 12.68 ng/mL — ABNORMAL LOW (ref 30.00–100.00)

## 2020-05-11 LAB — LDL CHOLESTEROL, DIRECT: Direct LDL: 139 mg/dL

## 2020-05-12 LAB — D-DIMER, QUANTITATIVE: D-Dimer, Quant: 0.34 mcg/mL FEU (ref <0.50)

## 2020-06-04 ENCOUNTER — Telehealth: Payer: Self-pay | Admitting: Family Medicine

## 2020-06-04 NOTE — Telephone Encounter (Signed)
Pt called and said she was sewing last night and sewed her finger and now she believes she needs a tetanus shot because she doesn't know when her last one was, She doesn't believe she needs to be seen for it because it missed the nail and the bone. Please advise

## 2020-06-04 NOTE — Telephone Encounter (Signed)
Spoke with patient who verbally understood that her last Tdap was July 2016 and at this time she does not need a vaccine also she was not punctured by a rusty/dirty needle so she should be fine we will follow up with her at her visit next week.

## 2020-06-11 ENCOUNTER — Other Ambulatory Visit: Payer: Self-pay

## 2020-06-12 ENCOUNTER — Ambulatory Visit: Payer: Managed Care, Other (non HMO) | Admitting: Family Medicine

## 2020-06-12 ENCOUNTER — Encounter: Payer: Self-pay | Admitting: Family Medicine

## 2020-06-12 VITALS — BP 140/78 | HR 81 | Temp 98.2°F | Ht 66.0 in | Wt 259.8 lb

## 2020-06-12 DIAGNOSIS — R2242 Localized swelling, mass and lump, left lower limb: Secondary | ICD-10-CM | POA: Diagnosis not present

## 2020-06-12 DIAGNOSIS — I10 Essential (primary) hypertension: Secondary | ICD-10-CM

## 2020-06-12 DIAGNOSIS — E559 Vitamin D deficiency, unspecified: Secondary | ICD-10-CM

## 2020-06-12 DIAGNOSIS — E7800 Pure hypercholesterolemia, unspecified: Secondary | ICD-10-CM

## 2020-06-12 MED ORDER — CHLORTHALIDONE 25 MG PO TABS: 25.0000 mg | ORAL_TABLET | Freq: Every day | ORAL | 1 refills | Status: DC

## 2020-06-12 MED ORDER — VITAMIN D (ERGOCALCIFEROL) 1.25 MG (50000 UNIT) PO CAPS: 50000.0000 [IU] | ORAL_CAPSULE | ORAL | 5 refills | Status: AC

## 2020-06-12 NOTE — Patient Instructions (Addendum)
Vitamin D Deficiency Vitamin D deficiency is when your body does not have enough vitamin D. Vitamin D is important to your body for many reasons:  It helps the body absorb two important minerals--calcium and phosphorus.  It plays a role in bone health.  It may help to prevent some diseases, such as diabetes and multiple sclerosis.  It plays a role in muscle function, including heart function. If vitamin D deficiency is severe, it can cause a condition in which your bones become soft. In adults, this condition is called osteomalacia. In children, this condition is called rickets. What are the causes? This condition may be caused by:  Not eating enough foods that contain vitamin D.  Not getting enough natural sun exposure.  Having certain digestive system diseases that make it difficult for your body to absorb vitamin D. These diseases include Crohn's disease, chronic pancreatitis, and cystic fibrosis.  Having a surgery in which a part of the stomach or a part of the small intestine is removed.  Having chronic kidney disease or liver disease. What increases the risk? You are more likely to develop this condition if you:  Are older.  Do not spend much time outdoors.  Live in a long-term care facility.  Have had broken bones.  Have weak or thin bones (osteoporosis).  Have a disease or condition that changes how the body absorbs vitamin D.  Have dark skin.  Take certain medicines, such as steroid medicines or certain seizure medicines.  Are overweight or obese. What are the signs or symptoms? In mild cases of vitamin D deficiency, there may not be any symptoms. If the condition is severe, symptoms may include:  Bone pain.  Muscle pain.  Falling often.  Broken bones caused by a minor injury. How is this diagnosed? This condition may be diagnosed with blood tests. Imaging tests such as X-rays may also be done to look for changes in the bone. How is this  treated? Treatment for this condition may depend on what caused the condition. Treatment options include:  Taking vitamin D supplements. Your health care provider will suggest what dose is best for you.  Taking a calcium supplement. Your health care provider will suggest what dose is best for you. Follow these instructions at home: Eating and drinking   Eat foods that contain vitamin D. Choices include: ? Fortified dairy products, cereals, or juices. Fortified means that vitamin D has been added to the food. Check the label on the package to see if the food is fortified. ? Fatty fish, such as salmon or trout. ? Eggs. ? Oysters. ? Mushrooms. The items listed above may not be a complete list of recommended foods and beverages. Contact a dietitian for more information. General instructions  Take medicines and supplements only as told by your health care provider.  Get regular, safe exposure to natural sunlight.  Do not use a tanning bed.  Maintain a healthy weight. Lose weight if needed.  Keep all follow-up visits as told by your health care provider. This is important. How is this prevented? You can get vitamin D by:  Eating foods that naturally contain vitamin D.  Eating or drinking products that have been fortified with vitamin D, such as cereals, juices, and dairy products (including milk).  Taking a vitamin D supplement or a multivitamin supplement that contains vitamin D.  Being in the sun. Your body naturally makes vitamin D when your skin is exposed to sunlight. Your body changes the sunlight into  a form of the vitamin that it can use. Contact a health care provider if:  Your symptoms do not go away.  You feel nauseous or you vomit.  You have fewer bowel movements than usual or are constipated. Summary  Vitamin D deficiency is when your body does not have enough vitamin D.  Vitamin D is important to your body for good bone health and muscle function, and it may  help prevent some diseases.  Vitamin D deficiency is primarily treated through supplementation. Your health care provider will suggest what dose is best for you.  You can get vitamin D by eating foods that contain vitamin D, by being in the sun, and by taking a vitamin D supplement or a multivitamin supplement that contains vitamin D. This information is not intended to replace advice given to you by your health care provider. Make sure you discuss any questions you have with your health care provider. Document Revised: 08/23/2018 Document Reviewed: 08/23/2018 Elsevier Patient Education  2020 [place].  BMI for Adults What is BMI? Body mass index (BMI) is a number that is calculated from a person's weight and height. BMI can help estimate how much of a person's weight is composed of fat. BMI does not measure body fat directly. Rather, it is an alternative to procedures that directly measure body fat, which can be difficult and expensive. BMI can help identify people who may be at higher risk for certain medical problems. What are BMI measurements used for? BMI is used as a screening tool to identify possible weight problems. It helps determine whether a person is obese, overweight, a healthy weight, or underweight. BMI is useful for:  Identifying a weight problem that may be related to a medical condition or may increase the risk for medical problems.  Promoting changes, such as changes in diet and exercise, to help reach a healthy weight. BMI screening can be repeated to see if these changes are working. How is BMI calculated? BMI involves measuring your weight in relation to your height. Both height and weight are measured, and the BMI is calculated from those numbers. This can be done either in [place] ([place] or metric measurements. Note that charts and online BMI calculators are available to help you find your BMI quickly and easily without having to do these calculations yourself. To  calculate your BMI in [place] ([place] measurements:  1. Measure your weight in pounds (lb). 2. Multiply the number of pounds by 703. ? For example, for a person who weighs 180 lb, multiply that number by 703, which equals 126,540. 3. Measure your height in inches. Then multiply that number by itself to get a measurement called "inches squared." ? For example, for a person who is 70 inches tall, the "inches squared" measurement is 70 inches x 70 inches, which equals 4,900 inches squared. 4. Divide the total from step 2 (number of lb x 703) by the total from step 3 (inches squared): 126,540  4,900 = 25.8. This is your BMI. To calculate your BMI in metric measurements: 1. Measure your weight in kilograms (kg). 2. Measure your height in meters (m). Then multiply that number by itself to get a measurement called "meters squared." ? For example, for a person who is 1.75 m tall, the "meters squared" measurement is 1.75 m x 1.75 m, which is equal to 3.1 meters squared. 3. Divide the number of kilograms (your weight) by the meters squared number. In this example: 70  3.1 = 22.6.  This is your BMI. What do the results mean? BMI charts are used to identify whether you are underweight, normal weight, overweight, or obese. The following guidelines will be used:  Underweight: BMI less than 18.5.  Normal weight: BMI between 18.5 and 24.9.  Overweight: BMI between 25 and 29.9.  Obese: BMI of 30 or above. Keep these notes in mind:  Weight includes both fat and muscle, so someone with a muscular build, such as an athlete, may have a BMI that is higher than 24.9. In cases like these, BMI is not an accurate measure of body fat.  To determine if excess body fat is the cause of a BMI of 25 or higher, further assessments may need to be done by a health care provider.  BMI is usually interpreted in the same way for men and women. Where to find more information For more information about BMI, including tools  to quickly calculate your BMI, go to these websites:  Centers for Disease Control and Prevention: http://www.wolf.info/  American Heart Association: www.heart.org  National Heart, Lung, and Blood Institute: https://wilson-eaton.com/ Summary  Body mass index (BMI) is a number that is calculated from a person's weight and height.  BMI may help estimate how much of a person's weight is composed of fat. BMI can help identify those who may be at higher risk for certain medical problems.  BMI can be measured using English measurements or metric measurements.  BMI charts are used to identify whether you are underweight, normal weight, overweight, or obese. This information is not intended to replace advice given to you by your health care provider. Make sure you discuss any questions you have with your health care provider. Document Revised: 09/07/2019 Document Reviewed: 07/15/2019 Elsevier Patient Education  [place].  Preventing High Cholesterol Cholesterol is a white, waxy substance similar to fat that the human body needs to help build cells. The liver makes all the cholesterol that a person's body needs. Having high cholesterol (hypercholesterolemia) increases a person's risk for heart disease and stroke. Extra (excess) cholesterol comes from the food the person eats. High cholesterol can often be prevented with diet and lifestyle changes. If you already have high cholesterol, you can control it with diet and lifestyle changes and with medicine. How can high cholesterol affect me? If you have high cholesterol, deposits (plaques) may build up on the walls of your arteries. The arteries are the blood vessels that carry blood away from your heart. Plaques make the arteries narrower and stiffer. This can limit or block blood flow and cause blood clots to form. Blood clots:  Are tiny balls of cells that form in your blood.  Can move to the heart or brain, causing a heart attack or stroke. Plaques in  arteries greatly increase your risk for heart attack and stroke.Making diet and lifestyle changes can reduce your risk for these conditions that may threaten your life. What can increase my risk? This condition is more likely to develop in people who:  Eat foods that are high in saturated fat or cholesterol. Saturated fat is mostly found in: ? Foods that contain animal fat, such as red meat and some dairy products. ? Certain fatty foods made from plants, such as tropical oils.  Are overweight.  Are not getting enough exercise.  Have a family history of high cholesterol. What actions can I take to prevent this? Nutrition   Eat less saturated fat.  Avoid trans fats (partially hydrogenated oils). These are often found  in margarine and in some baked goods, fried foods, and snacks bought in packages.  Avoid precooked or cured meat, such as sausages or meat loaves.  Avoid foods and drinks that have added sugars.  Eat more fruits, vegetables, and whole grains.  Choose healthy sources of protein, such as fish, poultry, lean cuts of red meat, beans, peas, lentils, and nuts.  Choose healthy sources of fat, such as: ? Nuts. ? Vegetable oils, especially olive oil. ? Fish that have healthy fats (omega-3 fatty acids), such as mackerel or salmon. The items listed above may not be a complete list of recommended foods and beverages. Contact a dietitian for more information. Lifestyle  Lose weight if you are overweight. Losing 5-10 lb (2.3-4.5 kg) can help prevent or control high cholesterol. It can also lower your risk for diabetes and high blood pressure. Ask your health care provider to help you with a diet and exercise plan to lose weight safely.  Do not use any products that contain nicotine or tobacco, such as cigarettes, e-cigarettes, and chewing tobacco. If you need help quitting, ask your health care provider.  Limit your alcohol intake. ? Do not drink alcohol if:  Your health care  provider tells you not to drink.  You are pregnant, may be pregnant, or are planning to become pregnant. ? If you drink alcohol:  Limit how much you use to:  0-1 drink a day for women.  0-2 drinks a day for men.  Be aware of how much alcohol is in your drink. In the [place] one drink equals one 12 oz bottle of beer (355 mL), one 5 oz glass of wine (148 mL), or one 1 oz glass of hard liquor (44 mL). Activity   Get enough exercise. Each week, do at least 150 minutes of exercise that takes a medium level of effort (moderate-intensity exercise). ? This is exercise that:  Makes your heart beat faster and makes you breathe harder than usual.  Allows you to still be able to talk. ? You could exercise in short sessions several times a day or longer sessions a few times a week. For example, on 5 days each week, you could walk fast or ride your bike 3 times a day for 10 minutes each time.  Do exercises as told by your health care provider. Medicines  In addition to diet and lifestyle changes, your health care provider may recommend medicines to help lower cholesterol. This may be a medicine to lower the amount of cholesterol your liver makes. You may need medicine if: ? Diet and lifestyle changes do not lower your cholesterol enough. ? You have high cholesterol and other risk factors for heart disease or stroke.  Take over-the-counter and prescription medicines only as told by your health care provider. General information  Manage your risk factors for high cholesterol. Talk with your health care provider about all your risk factors and how to lower your risk.  Manage other conditions that you have, such as diabetes or high blood pressure (hypertension).  Have blood tests to check your cholesterol levels at regular points in time as told by your health care provider.  Keep all follow-up visits as told by your health care provider. This is important. Where to find more  information  American Heart Association: www.heart.org  National Heart, Lung, and Blood Institute: https://wilson-eaton.com/ Summary  High cholesterol increases your risk for heart disease and stroke. By keeping your cholesterol level low, you can reduce your risk for these  conditions.  High cholesterol can often be prevented with diet and lifestyle changes.  Work with your health care provider to manage your risk factors, and have your blood tested regularly. This information is not intended to replace advice given to you by your health care provider. Make sure you discuss any questions you have with your health care provider. Document Revised: 04/08/2019 Document Reviewed: 08/23/2016 Elsevier Patient Education  [place].  Obesity, Adult Obesity is the condition of having too much total body fat. Being overweight or obese means that your weight is greater than what is considered healthy for your body size. Obesity is determined by a measurement called BMI. BMI is an estimate of body fat and is calculated from height and weight. For adults, a BMI of 30 or higher is considered obese. Obesity can lead to other health concerns and major illnesses, including:  Stroke.  Coronary artery disease (CAD).  Type 2 diabetes.  Some types of cancer, including cancers of the colon, breast, uterus, and gallbladder.  Osteoarthritis.  High blood pressure (hypertension).  High cholesterol.  Sleep apnea.  Gallbladder stones.  Infertility problems. What are the causes? Common causes of this condition include:  Eating daily meals that are high in calories, sugar, and fat.  Being born with genes that may make you more likely to become obese.  Having a medical condition that causes obesity, including: ? Hypothyroidism. ? Polycystic ovarian syndrome (PCOS). ? Binge-eating disorder. ? Cushing syndrome.  Taking certain medicines, such as steroids, antidepressants, and seizure  medicines.  Not being physically active (sedentary lifestyle).  Not getting enough sleep.  Drinking high amounts of sugar-sweetened beverages, such as soft drinks. What increases the risk? The following factors may make you more likely to develop this condition:  Having a family history of obesity.  Being a woman of African American descent.  Being a man of Hispanic descent.  Living in an area with limited access to: ? [place], recreation centers, or sidewalks. ? Healthy food choices, such as grocery stores and farmers' markets. What are the signs or symptoms? The main sign of this condition is having too much body fat. How is this diagnosed? This condition is diagnosed based on:  Your BMI. If you are an adult with a BMI of 30 or higher, you are considered obese.  Your waist circumference. This measures the distance around your waistline.  Your skinfold thickness. Your health care provider may gently pinch a fold of your skin and measure it. You may have other tests to check for underlying conditions. How is this treated? Treatment for this condition often includes changing your lifestyle. Treatment may include some or all of the following:  Dietary changes. This may include developing a healthy meal plan.  Regular physical activity. This may include activity that causes your heart to beat faster (aerobic exercise) and strength training. Work with your health care provider to design an exercise program that works for you.  Medicine to help you lose weight if you are unable to lose 1 pound a week after 6 weeks of healthy eating and more physical activity.  Treating conditions that cause the obesity (underlying conditions).  Surgery. Surgical options may include gastric banding and gastric bypass. Surgery may be done if: ? Other treatments have not helped to improve your condition. ? You have a BMI of 40 or higher. ? You have life-threatening health problems related to  obesity. Follow these instructions at home: Eating and drinking   Follow recommendations from  your health care provider about what you eat and drink. Your health care provider may advise you to: ? Limit fast food, sweets, and processed snack foods. ? Choose low-fat options, such as low-fat milk instead of whole milk. ? Eat 5 or more servings of fruits or vegetables every day. ? Eat at home more often. This gives you more control over what you eat. ? Choose healthy foods when you eat out. ? Learn to read food labels. This will help you understand how much food is considered 1 serving. ? Learn what a healthy serving size is. ? Keep low-fat snacks available. ? Limit sugary drinks, such as soda, fruit juice, sweetened iced tea, and flavored milk.  Drink enough water to keep your urine pale yellow.  Do not follow a fad diet. Fad diets can be unhealthy and even dangerous. Physical activity  Exercise regularly, as told by your health care provider. ? Most adults should get up to 150 minutes of moderate-intensity exercise every week. ? Ask your health care provider what types of exercise are safe for you and how often you should exercise.  Warm up and stretch before being active.  Cool down and stretch after being active.  Rest between periods of activity. Lifestyle  Work with your health care provider and a dietitian to set a weight-loss goal that is healthy and reasonable for you.  Limit your screen time.  Find ways to reward yourself that do not involve food.  Do not drink alcohol if: ? Your health care provider tells you not to drink. ? You are pregnant, may be pregnant, or are planning to become pregnant.  If you drink alcohol: ? Limit how much you use to:  0-1 drink a day for women.  0-2 drinks a day for men. ? Be aware of how much alcohol is in your drink. In the [place] one drink equals one 12 oz bottle of beer (355 mL), one 5 oz glass of wine (148 mL), or one 1 oz  glass of hard liquor (44 mL). General instructions  Keep a weight-loss journal to keep track of the food you eat and how much exercise you get.  Take over-the-counter and prescription medicines only as told by your health care provider.  Take vitamins and supplements only as told by your health care provider.  Consider joining a support group. Your health care provider may be able to recommend a support group.  Keep all follow-up visits as told by your health care provider. This is important. Contact a health care provider if:  You are unable to meet your weight loss goal after 6 weeks of dietary and lifestyle changes. Get help right away if you are having:  Trouble breathing.  Suicidal thoughts or behaviors. Summary  Obesity is the condition of having too much total body fat.  Being overweight or obese means that your weight is greater than what is considered healthy for your body size.  Work with your health care provider and a dietitian to set a weight-loss goal that is healthy and reasonable for you.  Exercise regularly, as told by your health care provider. Ask your health care provider what types of exercise are safe for you and how often you should exercise. This information is not intended to replace advice given to you by your health care provider. Make sure you discuss any questions you have with your health care provider. Document Revised: 08/19/2018 Document Reviewed: 08/19/2018 Elsevier Patient Education  2020 [employer].

## 2020-06-12 NOTE — Progress Notes (Signed)
Established Patient Office Visit  Subjective:  Patient ID: Emily [name], female    DOB: [birth year]  Age: 54 y.o. MRN: [id number]  CC:  Chief Complaint  Patient presents with  . Follow-up    follow up on BP and swelling in leg. Per patient swelling have gone down some. No concerns.     HPI Emily [name] presents for follow-up of her hypertension.  Blood pressure has improved.  She is not checking it at home.  Swelling remains in her ankles although it has been slightly improved with the chlorthalidone.  Compression stockings are not comfortable for her she tells me.  Tells me that her insurance refused the referral for weight loss management because I coded her as having morbid obesity.  She request the name of a good dermatologist  Past Medical History:  Diagnosis Date  . Chicken pox   . Colon polyp   . Genital warts   . GERD (gastroesophageal reflux disease)   . Meningitis spinal 2012  . Overactive bladder     Past Surgical History:  Procedure Laterality Date  . BREAST SURGERY  '84  . CESAREAN SECTION  '92, '01   x's 2    No family history on file.  Social History   Socioeconomic History  . Marital status: Married    Spouse name: Not on file  . Number of children: Not on file  . Years of education: Not on file  . 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 Smoking status: Never Smoker  . Smokeless tobacco: Never Used  Substance and Sexual Activity  . Alcohol use: Not Currently    Alcohol/week: 0.0 standard drinks  . Drug use: No  . Sexual activity: Yes    Partners: Male  Other Topics Concern  . Not on file  Social History Narrative   Exercise-- no   Social Determinants of Health   Financial Resource Strain:   . Difficulty of Paying Living Expenses:   Food Insecurity:   . Worried About Charity fundraiser in the Last Year:   . Arboriculturist in the Last Year:   Transportation Needs:   . Consulting civil engineer (Medical):   Marland Kitchen Lack of Transportation (Non-Medical):   Physical Activity:   . Days of Exercise per Week:   . Minutes of Exercise per Session:   Stress:   . Feeling of Stress :   Social Connections:   . Frequency of Communication with Friends and Family:   . Frequency of Social Gatherings with Friends and Family:   . Attends Religious Services:   . Active Member of Clubs or Organizations:   . Attends Archivist Meetings:   Marland Kitchen Marital Status:   Intimate Partner Violence:   . Fear of Current or Ex-Partner:   . Emotionally Abused:   Marland Kitchen Physically Abused:   . Sexually Abused:     Outpatient Medications Prior to Visit  Medication Sig Dispense Refill  . celecoxib (CELEBREX) 200 MG capsule One to 2 tablets by mouth daily as needed for pain. 60 capsule 2  . Mirabegron (MYRBETRIQ PO) Take by mouth.    Marland Kitchen MYRBETRIQ 50 MG TB24 tablet Take 50 mg by mouth daily.    . chlorthalidone (HYGROTON) 25 MG tablet Take 1 tablet (25 mg total) by mouth daily. 30 tablet 2  . acetaminophen (TYLENOL) 650 MG CR tablet Take 1 tablet (650 mg total)  by mouth every 8 (eight) hours as needed for pain. (Patient not taking: Reported on 06/12/2020) 90 tablet 3   No facility-administered medications prior to visit.    Allergies  Allergen Reactions  . Other Other (See Comments)    Red meat- abdominal pain    ROS Review of Systems  Constitutional: Negative.   Respiratory: Negative.   Cardiovascular: Negative.   Gastrointestinal: Negative.   Genitourinary: Negative.   Musculoskeletal: Positive for arthralgias. Negative for gait problem.  Neurological: Negative for speech difficulty and weakness.  Psychiatric/Behavioral: Negative.    Depression screen [facility] 2/9 06/12/2020 07/14/2016 04/10/2015  Decreased Interest 0 0 0  Down, Depressed, Hopeless 0 0 0  PHQ - 2 Score 0 0 0  Altered sleeping 0 - -  Tired, decreased energy 0 - -  Change in appetite 0 - -  Feeling bad or failure about  yourself  1 - -  Trouble concentrating 0 - -  Moving slowly or fidgety/restless 0 - -  Suicidal thoughts 0 - -  PHQ-9 Score 1 - -  Difficult doing work/chores Somewhat difficult - -      Objective:    Physical Exam Vitals and nursing note reviewed.  Constitutional:      General: She is not in acute distress.    Appearance: Normal appearance. She is obese. She is not ill-appearing, toxic-appearing or diaphoretic.  HENT:     Head: Normocephalic and atraumatic.     Right Ear: External ear normal.     Left Ear: External ear normal.  Eyes:     General:        Right eye: No discharge.        Left eye: No discharge.     Conjunctiva/sclera: Conjunctivae normal.  Cardiovascular:     Rate and Rhythm: Normal rate and regular rhythm.  Pulmonary:     Effort: Pulmonary effort is normal.     Breath sounds: Normal breath sounds.  Musculoskeletal:     Cervical back: No rigidity or tenderness.     Right lower leg: No edema.     Left lower leg: No edema.       Legs:  Lymphadenopathy:     Cervical: No cervical adenopathy.  Neurological:     Mental Status: She is alert and oriented to person, place, and time.  Psychiatric:        Mood and Affect: Mood normal.        Behavior: Behavior normal.     BP 140/78   Pulse 81   Temp 98.2 F (36.8 C) (Tympanic)   Ht 5\' 6"  (1.676 m)   Wt 259 lb 12.8 oz (117.8 kg)   LMP 03/29/2016   SpO2 98%   BMI 41.93 kg/m  Wt Readings from Last 3 Encounters:  06/12/20 259 lb 12.8 oz (117.8 kg)  05/09/20 257 lb 9.6 oz (116.8 kg)  05/07/18 240 lb 6 oz (109 kg)     Health Maintenance Due  Topic Date Due  . COVID-19 Vaccine (1) Never done  . HIV Screening  Never done    There are no preventive care reminders to display for this patient.  Lab Results  Component Value Date   TSH 1.70 04/15/2018   Lab Results  Component Value Date   WBC 4.2 05/11/2020   HGB 13.3 05/11/2020   HCT 39.8 05/11/2020   MCV 84.0 05/11/2020   PLT 238.0 05/11/2020     Lab Results  Component Value Date   NA 139  05/11/2020   K 3.7 05/11/2020   CO2 28 05/11/2020   GLUCOSE 92 05/11/2020   BUN 14 05/11/2020   CREATININE 0.79 05/11/2020   BILITOT 0.3 05/11/2020   ALKPHOS 74 05/11/2020   AST 17 05/11/2020   ALT 15 05/11/2020   PROT 6.0 05/11/2020   ALBUMIN 3.9 05/11/2020   CALCIUM 8.7 05/11/2020   GFR 75.23 05/11/2020   Lab Results  Component Value Date   CHOL 233 (H) 05/11/2020   Lab Results  Component Value Date   HDL 52.10 05/11/2020   Lab Results  Component Value Date   LDLCALC 153 (H) 05/11/2020   Lab Results  Component Value Date   TRIG 139.0 05/11/2020   Lab Results  Component Value Date   CHOLHDL 4 05/11/2020   No results found for: HGBA1C    Assessment & Plan:   Problem List Items Addressed This Visit      Cardiovascular and Mediastinum   Essential hypertension - Primary   Relevant Medications   chlorthalidone (HYGROTON) 25 MG tablet     Other   Localized swelling of left lower leg   Relevant Medications   chlorthalidone (HYGROTON) 25 MG tablet    Other Visit Diagnoses    Vitamin D deficiency       Relevant Medications   Vitamin D, Ergocalciferol, (DRISDOL) 1.25 MG (50000 UNIT) CAPS capsule   Elevated LDL cholesterol level          Meds ordered this encounter  Medications  . Vitamin D, Ergocalciferol, (DRISDOL) 1.25 MG (50000 UNIT) CAPS capsule    Sig: Take 1 capsule (50,000 Units total) by mouth every 7 (seven) days.    Dispense:  5 capsule    Refill:  5  . chlorthalidone (HYGROTON) 25 MG tablet    Sig: Take 1 tablet (25 mg total) by mouth daily.    Dispense:  90 tablet    Refill:  1    Follow-up: No follow-ups on file.  Patient was given information on vitamin D deficiency, BMI and prevention of high cholesterol.  Again discussed the importance of weight loss with regards to the arthritis in her feet and her general health and wellbeing she will check and record her blood pressures periodically.   Will make her own appointment with Dr. Pamala Duffel for skin check.  Goal blood pressure for her would be in the less than 140 over less than 90 range.  Libby Maw, MD

## 2020-07-09 ENCOUNTER — Telehealth (INDEPENDENT_AMBULATORY_CARE_PROVIDER_SITE_OTHER): Payer: Managed Care, Other (non HMO) | Admitting: Family Medicine

## 2020-07-09 ENCOUNTER — Encounter: Payer: Self-pay | Admitting: Family Medicine

## 2020-07-09 VITALS — Ht 66.0 in

## 2020-07-09 DIAGNOSIS — E559 Vitamin D deficiency, unspecified: Secondary | ICD-10-CM | POA: Diagnosis not present

## 2020-07-09 DIAGNOSIS — H8309 Labyrinthitis, unspecified ear: Secondary | ICD-10-CM

## 2020-07-09 DIAGNOSIS — I10 Essential (primary) hypertension: Secondary | ICD-10-CM

## 2020-07-09 NOTE — Patient Instructions (Signed)
How to Perform the Epley Maneuver The Epley maneuver is an exercise that relieves symptoms of vertigo. Vertigo is the feeling that you or your surroundings are moving when they are not. When you feel vertigo, you may feel like the room is spinning and have trouble walking. Dizziness is a little different than vertigo. When you are dizzy, you may feel unsteady or light-headed. You can do this maneuver at home whenever you have symptoms of vertigo. You can do it up to 3 times a day until your symptoms go away. Even though the Epley maneuver may relieve your vertigo for a few weeks, it is possible that your symptoms will return. This maneuver relieves vertigo, but it does not relieve dizziness. What are the risks? If it is done correctly, the Epley maneuver is considered safe. Sometimes it can lead to dizziness or nausea that goes away after a short time. If you develop other symptoms, such as changes in vision, weakness, or numbness, stop doing the maneuver and call your health care provider. How to perform the Epley maneuver 1. Sit on the edge of a bed or table with your back straight and your legs extended or hanging over the edge of the bed or table. 2. Turn your head halfway toward the affected ear or side. 3. Lie backward quickly with your head turned until you are lying flat on your back. You may want to position a pillow under your shoulders. 4. Hold this position for 30 seconds. You may experience an attack of vertigo. This is normal. 5. Turn your head to the opposite direction until your unaffected ear is facing the floor. 6. Hold this position for 30 seconds. You may experience an attack of vertigo. This is normal. Hold this position until the vertigo stops. 7. Turn your whole body to the same side as your head. Hold for another 30 seconds. 8. Sit back up. You can repeat this exercise up to 3 times a day. Follow these instructions at home:  After doing the Epley maneuver, you can return to  your normal activities.  Ask your health care provider if there is anything you should do at home to prevent vertigo. He or she may recommend that you: ? Keep your head raised (elevated) with two or more pillows while you sleep. ? Do not sleep on the side of your affected ear. ? Get up slowly from bed. ? Avoid sudden movements during the day. ? Avoid extreme head movement, like looking up or bending over. Contact a health care provider if:  Your vertigo gets worse.  You have other symptoms, including: ? Nausea. ? Vomiting. ? Headache. Get help right away if:  You have vision changes.  You have a severe or worsening headache or neck pain.  You cannot stop vomiting.  You have new numbness or weakness in any part of your body. Summary  Vertigo is the feeling that you or your surroundings are moving when they are not.  The Epley maneuver is an exercise that relieves symptoms of vertigo.  If the Epley maneuver is done correctly, it is considered safe. You can do it up to 3 times a day. This information is not intended to replace advice given to you by your health care provider. Make sure you discuss any questions you have with your health care provider. Document Revised: 11/27/2017 Document Reviewed: 11/04/2016 Elsevier Patient Education  [place].  Labyrinthitis  Labyrinthitis is an inner ear infection. The inner ear is a system  of tubes and canals (labyrinth) that are filled with fluid. The inner ear also contains nerve cells that send hearing and balance signals to the brain. When tiny germs (microorganisms) get inside the labyrinth, they harm the cells that send messages to the brain. This can cause changes in hearing and balance. Labyrinthitis usually develops suddenly and goes away with treatment in a few weeks (acute labyrinthitis). If the infection damages parts of the labyrinth, some symptoms may last for a long time (chronic labyrinthitis). What are the  causes? Labyrinthitis is most often caused by a virus, such as one that causes:  Infectious mononucleosis, also called "mono."  Measles.  The flu.  Herpes. Labyrinthitis can also be caused by bacteria that spread from an infection in the brain or the middle ear (suppurative labyrinthitis). In some cases, the bacteria may produce a poison (toxin) that gets inside the labyrinth (serous labyrinthitis). What increases the risk? You may be at greater risk for labyrinthitis if you:  Recently had a mouth, nose, or throat infection (upper respiratory infection) or an ear infection.  Drink a lot of alcohol.  Smoke.  Use certain drugs.  Are not well-rested (are fatigued).  Are experiencing a lot of stress.  Have allergies. What are the signs or symptoms? Symptoms of labyrinthitis usually start suddenly. The symptoms may range from mild to severe, and may include:  Dizziness.  Hearing loss.  A feeling that you or your surroundings are moving when they are not (vertigo).  Ringing in your ear (tinnitus).  Nausea and vomiting.  Trouble focusing your eyes. Symptoms of chronic labyrinthitis may include:  Fatigue.  Confusion.  Hearing loss.  Tinnitus.  Poor balance.  Vertigo after sudden head movements. How is this diagnosed? This condition may be diagnosed based on:  Your symptoms and medical history. Your health care provider may ask about any dizziness or hearing loss you have and any recent upper respiratory infections.  A physical exam that involves: ? Checking your ears for infection. ? Testing your balance. ? Checking your eye movement.  Hearing tests.  Imaging tests, such as CT scan or MRI.  Tests of your eye movements (electronystagmogram, ENG). How is this treated?  Treatment depends on the cause. If your condition is caused by bacteria, you may need antibiotic medicine. If it is caused by a virus, it may get better on its own. Regardless of the cause,  you may be treated with:  Medicines to: ? Stop dizziness. ? Relieve nausea. ? Reduce inflammation. ? Speed up your recovery.  Rest. You may be asked to limit your activities until dizziness goes away.  IV fluids. These may be given at a hospital. You may need IV fluids if you have severe nausea and vomiting.  Physical therapy. A therapist can teach you exercises to help you adjust to feeling dizzy (vestibular rehabilitation exercises). You may need this if you have dizziness that does not go away. Follow these instructions at home: Medicines  Take over-the-counter and prescription medicines only as told by your health care provider.  If you were prescribed an antibiotic medicine, take it exactly as told by your health care provider. Do not stop taking the antibiotic even if you start to feel better. Activity  Rest as much as possible.  Limit your activity as directed. Ask your health care provider what activities are safe for you.  Do not make sudden movements until any dizziness goes away.  If physical therapy was prescribed, do exercises as directed. General  instructions  Avoid loud noises and bright lights.  Do not drive until your health care provider says that this is safe for you.  Drink enough fluid to keep your urine pale yellow.  Keep all follow-up visits as told by your health care provider. This is important. Contact a health care provider if you have:  Symptoms that do not get better with medicine.  Symptoms that last longer than two weeks.  A fever. Get help right away if you have:  Nausea or vomiting that is severe or does not go away.  Severe dizziness.  Sudden hearing loss. Summary  Labyrinthitis is an infection of the inner ear. It can cause changes in hearing and balance.  Symptoms usually start suddenly and include dizziness, hearing loss, and nausea and vomiting. You may also have ringing in your ear (tinnitus), trouble focusing your eyes,  and a feeling that you or your surroundings are moving when they are not (vertigo).  If the condition lasts more than a few weeks, symptoms may include fatigue, confusion, hearing loss, poor balance, tinnitus, and vertigo.  Treatment depends on the cause. If your labyrinthitis is caused by bacteria, you may need antibiotic medicine. If your labyrinthitis is caused by a virus, it may get better on its own.  Follow your health care provider's instructions, including how to take medicines, what activities to avoid, and when to get medical help. This information is not intended to replace advice given to you by your health care provider. Make sure you discuss any questions you have with your health care provider. Document Revised: 10/04/2018 Document Reviewed: 12/26/2017 Elsevier Patient Education  2020 [employer].

## 2020-07-09 NOTE — Progress Notes (Signed)
Established Patient Office Visit  Subjective:  Patient ID: Emily [name], female    DOB: 1966/01/07  Age: 54 y.o. MRN: [id number]  CC:  Chief Complaint  Patient presents with  . Dizziness    C/O of dizziness that come and go x 1-2 years patient not sure what is causing this.     HPI Emily [name] presents for follow-up of recurrent bouts of labyrinthitis or vertigo.  Her initial experience was in 2013 with that seem to be associated with a SARS-like virus she obtained by walking through the [place] airport.  She was hospitalized for this illness and was told that this virus would be alive in her body for the rest of her life.  She did have a mild case of vertigo with that illness.  She saw me again in 2019 with a recurrence.  She has had recurrent attacks that seem to be associated with weekend trips up into the mountains of [place].  Her attacks are severe and she finds it difficult to take the meclizine in the acute stages of an attack.  She saw a physical therapist who is renown for treating people with vertigo associated with displaced crystals in the labyrinth system who told her that in her case her crystals were not displaced.  Patient has tried the Epley maneuvers and that seemed to make matters worse.  She denies any hearing loss and is only experienced mild headaches.  She continues with her blood pressure medicine.  She is taking her high-dose vitamin D.  Past Medical History:  Diagnosis Date  . Chicken pox   . Colon polyp   . Genital warts   . GERD (gastroesophageal reflux disease)   . Meningitis spinal 2012  . Overactive bladder     Past Surgical History:  Procedure Laterality Date  . BREAST SURGERY  '84  . CESAREAN SECTION  '92, '01   x's 2    No family history on file.  Social History   Socioeconomic History  . Marital status: Married    Spouse name: Not on file  . Number of children: Not on file  . Years of education: Not on file  .  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 Smoking status: Never Smoker  . Smokeless tobacco: Never Used  Substance and Sexual Activity  . Alcohol use: Not Currently    Alcohol/week: 0.0 standard drinks  . Drug use: No  . Sexual activity: Yes    Partners: Male  Other Topics Concern  . Not on file  Social History Narrative   Exercise-- no   Social Determinants of Health   Financial Resource Strain:   . Difficulty of Paying Living Expenses:   Food Insecurity:   . Worried About Charity fundraiser in the Last Year:   . Arboriculturist in the Last Year:   Transportation Needs:   . Film/video editor (Medical):   Marland Kitchen Lack of Transportation (Non-Medical):   Physical Activity:   . Days of Exercise per Week:   . Minutes of Exercise per Session:   Stress:   . Feeling of Stress :   Social Connections:   . Frequency of Communication with Friends and Family:   . Frequency of Social Gatherings with Friends and Family:   . Attends Religious Services:   . Active Member of Clubs or Organizations:   . Attends Archivist Meetings:   .  Marital Status:   Intimate Partner Violence:   . Fear of Current or Ex-Partner:   . Emotionally Abused:   Marland Kitchen Physically Abused:   . Sexually Abused:     Outpatient Medications Prior to Visit  Medication Sig Dispense Refill  . celecoxib (CELEBREX) 200 MG capsule One to 2 tablets by mouth daily as needed for pain. 60 capsule 2  . chlorthalidone (HYGROTON) 25 MG tablet Take 1 tablet (25 mg total) by mouth daily. 90 tablet 1  . MYRBETRIQ 50 MG TB24 tablet Take 50 mg by mouth daily.    . Vitamin D, Ergocalciferol, (DRISDOL) 1.25 MG (50000 UNIT) CAPS capsule Take 1 capsule (50,000 Units total) by mouth every 7 (seven) days. 5 capsule 5  . acetaminophen (TYLENOL) 650 MG CR tablet Take 1 tablet (650 mg total) by mouth every 8 (eight) hours as needed for pain. (Patient not taking: Reported on  06/12/2020) 90 tablet 3  . Mirabegron (MYRBETRIQ PO) Take by mouth. (Patient not taking: Reported on 07/09/2020)     No facility-administered medications prior to visit.    Allergies  Allergen Reactions  . Other Other (See Comments)    Red meat- abdominal pain    ROS Review of Systems  Constitutional: Negative for chills, diaphoresis, fatigue, fever and unexpected weight change.  HENT: Negative for hearing loss and postnasal drip.   Eyes: Negative for photophobia and visual disturbance.  Respiratory: Negative.   Cardiovascular: Negative.   Gastrointestinal: Negative.   Neurological: Positive for dizziness. Negative for facial asymmetry, speech difficulty, weakness and headaches (mild).  Psychiatric/Behavioral: Negative.       Objective:    Physical Exam Nursing note reviewed.  Constitutional:      General: She is not in acute distress.    Appearance: Normal appearance. She is not ill-appearing, toxic-appearing or diaphoretic.  HENT:     Head: Normocephalic and atraumatic.     Right Ear: External ear normal.     Left Ear: External ear normal.  Eyes:     General: No scleral icterus.       Right eye: No discharge.        Left eye: No discharge.     Conjunctiva/sclera: Conjunctivae normal.  Cardiovascular:     Pulses: Normal pulses.  Neurological:     Mental Status: She is alert and oriented to person, place, and time.  Psychiatric:        Mood and Affect: Mood normal.        Behavior: Behavior normal.     Ht 5\' 6"  (1.676 m)   LMP 03/29/2016   BMI 41.93 kg/m  Wt Readings from Last 3 Encounters:  06/12/20 259 lb 12.8 oz (117.8 kg)  05/09/20 257 lb 9.6 oz (116.8 kg)  05/07/18 240 lb 6 oz (109 kg)     Health Maintenance Due  Topic Date Due  . COVID-19 Vaccine (1) Never done  . HIV Screening  Never done    There are no preventive care reminders to display for this patient.  Lab Results  Component Value Date   TSH 1.70 04/15/2018   Lab Results    Component Value Date   WBC 4.2 05/11/2020   HGB 13.3 05/11/2020   HCT 39.8 05/11/2020   MCV 84.0 05/11/2020   PLT 238.0 05/11/2020   Lab Results  Component Value Date   NA 139 05/11/2020   K 3.7 05/11/2020   CO2 28 05/11/2020   GLUCOSE 92 05/11/2020   BUN 14 05/11/2020  CREATININE 0.79 05/11/2020   BILITOT 0.3 05/11/2020   ALKPHOS 74 05/11/2020   AST 17 05/11/2020   ALT 15 05/11/2020   PROT 6.0 05/11/2020   ALBUMIN 3.9 05/11/2020   CALCIUM 8.7 05/11/2020   GFR 75.23 05/11/2020   Lab Results  Component Value Date   CHOL 233 (H) 05/11/2020   Lab Results  Component Value Date   HDL 52.10 05/11/2020   Lab Results  Component Value Date   LDLCALC 153 (H) 05/11/2020   Lab Results  Component Value Date   TRIG 139.0 05/11/2020   Lab Results  Component Value Date   CHOLHDL 4 05/11/2020   No results found for: HGBA1C    Assessment & Plan:   Problem List Items Addressed This Visit      Cardiovascular and Mediastinum   Essential hypertension     Nervous and Auditory   Labyrinthitis   Relevant Orders   Ambulatory referral to Neurology    Other Visit Diagnoses    Vitamin D deficiency    -  Primary      No orders of the defined types were placed in this encounter.   Follow-up: Return in about 3 months (around 10/09/2020).   Discussed the phenomena of extinguishment with the patient.  Advised her to go ahead and try to work through the Epley maneuvers.  She would like to see a specialist for further evaluation. Libby Maw, MD   Virtual Visit via Video Note  I connected with Emily [name] on 07/09/20 at  4:00 PM EDT by a video enabled telemedicine application and verified that I am speaking with the correct person using two identifiers.  Location: Patient: home alone  Provider:    I discussed the limitations of evaluation and management by telemedicine and the availability of in person appointments. The patient expressed understanding and  agreed to proceed.  History of Present Illness:    Observations/Objective:   Assessment and Plan:   Follow Up Instructions:    I discussed the assessment and treatment plan with the patient. The patient was provided an opportunity to ask questions and all were answered. The patient agreed with the plan and demonstrated an understanding of the instructions.   The patient was advised to call back or seek an in-person evaluation if the symptoms worsen or if the condition fails to improve as anticipated.  I provided 30 minutes of non-face-to-face time during this encounter.   Libby Maw, MD

## 2020-08-31 ENCOUNTER — Other Ambulatory Visit: Payer: Self-pay

## 2020-08-31 ENCOUNTER — Encounter: Payer: Self-pay | Admitting: Neurology

## 2020-08-31 ENCOUNTER — Ambulatory Visit: Payer: Managed Care, Other (non HMO) | Admitting: Neurology

## 2020-08-31 VITALS — BP 122/84 | HR 74 | Ht 67.0 in | Wt 237.1 lb

## 2020-08-31 DIAGNOSIS — R42 Dizziness and giddiness: Secondary | ICD-10-CM

## 2020-08-31 MED ORDER — DEXAMETHASONE 2 MG PO TABS
ORAL_TABLET | ORAL | 0 refills | Status: DC
Start: 2020-08-31 — End: 2021-03-25

## 2020-08-31 MED ORDER — PROMETHAZINE HCL 25 MG RE SUPP: 25.0000 mg | Freq: Four times a day (QID) | RECTAL | 1 refills | Status: DC | PRN

## 2020-08-31 NOTE — Progress Notes (Signed)
Reason for visit: Vertigo  Referring physician: Dr. Cleotis Nipper [name] is a 54 y.o. female  History of present illness:  Emily [name] is a 54 year old right-handed white female with a history of episodes of vertigo that began in April 2019.  The episode was severe for about 4 or 5 days and then gradually resolved over 2 weeks.  The patient returned to a completely normal state until October 2020.  The patient had a very similar event to the first.  Both of these events occurred when the patient was visiting in the mountains [place] higher elevation.  The patient has had 2 other events, one occurring in February 2021 and again in July 2021.  These events occurred while she was at home in [facility].  The first 2 episodes occurred upon awakening in the morning, the second 2 occurred while she was up and active.  The patient had vertigo with nausea and vomiting, with the fourth event she had a sensation of oscillopsia with a scrolling phenomena in the vertical plane.  The patient does not recall any headache with the above events.  She clenched her teeth with the first event and had some TMJ discomfort on the right but no intrinsic ear pain was noted.  She has not had any hearing changes or muffled hearing.  She reports no loss of vision or double vision.  She has no slurred speech or loss of consciousness.  She reports some intermittent numbness of the hands that has been noted over the last month or so.  She otherwise has not had any numbness or weakness of the extremities.  Since the most recent event in July 2021, she feels that her balance never fully returned to normal.  She denies any significant neck pain.  She has no prior history of migraine.  She is sent to this office for an evaluation.  She was told that she had benign positional vertigo, but when she went to vestibular rehabilitation in October 2020, the physical therapist told her that she did not have this entity.  Past Medical History:    Diagnosis Date  . Chicken pox   . Colon polyp   . Genital warts   . GERD (gastroesophageal reflux disease)   . Meningitis spinal 2012  . Overactive bladder   . Vertigo     Past Surgical History:  Procedure Laterality Date  . BREAST SURGERY  '84  . CESAREAN SECTION  '92, '01   x's 2    No family history on file.  Social history:  reports that she has never smoked. She has never used smokeless tobacco. She reports previous alcohol use. She reports that she does not use drugs.  Medications:  Prior to Admission medications   Medication Sig Start Date End Date Taking? Authorizing Provider  celecoxib (CELEBREX) 200 MG capsule One to 2 tablets by mouth daily as needed for pain. 04/26/20  Yes Silverio Decamp, MD  Vitamin D, Ergocalciferol, (DRISDOL) 1.25 MG (50000 UNIT) CAPS capsule Take 1 capsule (50,000 Units total) by mouth every 7 (seven) days. 06/12/20 09/10/20 Yes Libby Maw, MD      Allergies  Allergen Reactions  . Other Other (See Comments)    Red meat- abdominal pain    ROS:  Out of a complete 14 system review of symptoms, the patient complains only of the following symptoms, and all other reviewed systems are negative.  Vertigo  Blood pressure 122/84, pulse 74, height 5\' 7"  (  1.702 m), weight 237 lb 2 oz (107.6 kg), last menstrual period 03/29/2016, SpO2 98 %.  Physical Exam  General: The patient is alert and cooperative at the time of the examination.  The patient is moderately to markedly obese.  Eyes: Pupils are equal, round, and reactive to light. Discs are flat bilaterally.  Ears: Tympanic membrane on the left is clear, the right tympanic membrane is obscured by cerumen.  Neck: The neck is supple, no carotid bruits are noted.  Respiratory: The respiratory examination is clear.  Cardiovascular: The cardiovascular examination reveals a regular rate and rhythm, no obvious murmurs or rubs are noted.  Skin: Extremities are without  significant edema.  Neurologic Exam  Mental status: The patient is alert and oriented x 3 at the time of the examination. The patient has apparent normal recent and remote memory, with an apparently normal attention span and concentration ability.  Cranial nerves: Facial symmetry is present. There is good sensation of the face to pinprick and soft touch bilaterally. The strength of the facial muscles and the muscles to head turning and shoulder shrug are normal bilaterally. Speech is well enunciated, no aphasia or dysarthria is noted. Extraocular movements are full. Visual fields are full. The tongue is midline, and the patient has symmetric elevation of the soft palate. No obvious hearing deficits are noted.  Motor: The motor testing reveals 5 over 5 strength of all 4 extremities. Good symmetric motor tone is noted throughout.  Sensory: Sensory testing is intact to pinprick, soft touch, vibration sensation, and position sense on all 4 extremities. No evidence of extinction is noted.  Coordination: Cerebellar testing reveals good finger-nose-finger and heel-to-shin bilaterally.  Gait and station: Gait is normal. Tandem gait is slightly unsteady. Romberg is negative. No drift is seen.  Reflexes: Deep tendon reflexes are symmetric and normal bilaterally. Toes are downgoing bilaterally.   Assessment/Plan:  1.  Episodic vertigo  I suppose it is still possible the patient could have benign positional vertigo.  Other causes of episodic vertigo may include migraine type phenomenon.  The patient has a normal clinical examination.  She will be sent for MRI of the brain with and without gadolinium enhancement.  She will be given Phenergan suppositories to take with the next episode and a 3-day Decadron taper.  She will try a home remedy to clear the cerumen in the right ear canal.  She will follow-up here in 6 months.  Jill Alexanders MD 08/31/2020 9:16 AM  [facility] [address] [place], [place] [address]  Phone [phone] Fax [phone]

## 2020-09-04 ENCOUNTER — Telehealth: Payer: Self-pay | Admitting: Neurology

## 2020-09-04 NOTE — Telephone Encounter (Signed)
[employer] order sent to GI. They will obtain the auth and reach out to the patient to schedule.  

## 2020-09-16 ENCOUNTER — Ambulatory Visit
Admission: RE | Admit: 2020-09-16 | Discharge: 2020-09-16 | Disposition: A | Discharge status: Home / Self Care | Payer: Managed Care, Other (non HMO) | Source: Ambulatory Visit | Attending: Neurology | Admitting: Neurology

## 2020-09-16 ENCOUNTER — Other Ambulatory Visit: Payer: Self-pay

## 2020-09-16 DIAGNOSIS — R42 Dizziness and giddiness: Secondary | ICD-10-CM | POA: Diagnosis not present

## 2020-09-16 MED ORDER — GADOBENATE DIMEGLUMINE 529 MG/ML IV SOLN
20.0000 mL | Freq: Once | INTRAVENOUS | Status: AC | PRN
  Administered 2020-09-16: 20 mL via INTRAVENOUS

## 2020-09-18 ENCOUNTER — Telehealth: Payer: Self-pay | Admitting: Neurology

## 2020-09-18 NOTE — Telephone Encounter (Signed)
  I called the patient.  MRI of the brain is essentially normal, there are a couple of small white matter lesions that are nonspecific, no source of vertigo noted.  The patient will take Decadron and Phenergan with the next episode, it is possible she may be having migrainous events.  MRI brain 09/16/20:  IMPRESSION: This MRI of the brain with and without contrast shows the following: 1.     Nonspecific white matter T2/FLAIR hyperintense foci in the hemispheres likely representing very minimal chronic microvascular ischemic change, typical for age.  None of the foci appear to be acute and they do not enhance. 2.   Small developmental venous anomaly in the left frontal lobe.  This is unlikely to be symptomatic. 3.   No acute findings.

## 2021-03-11 ENCOUNTER — Ambulatory Visit: Payer: Managed Care, Other (non HMO) | Admitting: Neurology

## 2021-03-25 ENCOUNTER — Ambulatory Visit (INDEPENDENT_AMBULATORY_CARE_PROVIDER_SITE_OTHER): Payer: Managed Care, Other (non HMO) | Admitting: Family Medicine

## 2021-03-25 ENCOUNTER — Ambulatory Visit (INDEPENDENT_AMBULATORY_CARE_PROVIDER_SITE_OTHER)
Admission: RE | Admit: 2021-03-25 | Discharge: 2021-03-25 | Disposition: A | Discharge status: Home / Self Care | Payer: Managed Care, Other (non HMO) | Source: Ambulatory Visit | Attending: Family Medicine | Admitting: Family Medicine

## 2021-03-25 ENCOUNTER — Encounter: Payer: Self-pay | Admitting: Family Medicine

## 2021-03-25 ENCOUNTER — Other Ambulatory Visit: Payer: Self-pay

## 2021-03-25 VITALS — BP 118/76 | HR 57 | Temp 97.6°F | Ht 67.0 in | Wt 198.4 lb

## 2021-03-25 DIAGNOSIS — M25511 Pain in right shoulder: Secondary | ICD-10-CM

## 2021-03-25 DIAGNOSIS — L299 Pruritus, unspecified: Secondary | ICD-10-CM | POA: Diagnosis not present

## 2021-03-25 NOTE — Progress Notes (Signed)
[facility] [address] [place] [address] Dept: [phone] Dept Fax: [phone]  Office Visit  Subjective:    Patient ID: Emily [name], female    DOB: 05-23-1966, 55 y.o..   MRN: [id number]  Chief Complaint  Patient presents with  . Acute Visit    C/o having RT side collar bone sticks out more.      History of Present Illness:  Patient is in today having noted an enlargement over the proximal end of the right clavicle. She says she noticed this about a week ago, but is not certain that it has not been present much longer. She denies any known trauma to the chest or clavicle. She says this is tender with pressing over the joint. She says she wants an x-ray to determine if this is something worrisome.  Emily [name] also notes an area of the left mid back that is occasionally pruritic. She wonders if the skin in this area looks normal. She describes a history of a precancerous lesion on her lip that occurred in her 59s.  Past Medical History: Patient Active Problem List   Diagnosis Date Noted  . Localized swelling of left lower leg 05/09/2020  . Essential hypertension 05/09/2020  . Pain in left ankle and joints of left foot 03/27/2020  . Adenomatous colon polyp 11/03/2019  . Labyrinthitis 05/07/2018  . Healthcare maintenance 05/07/2018  . Degenerative disc disease, cervical 05/07/2018  . Bilateral impacted cerumen 04/15/2018  . Left ear pain 04/15/2018  . Vertigo 04/15/2018  . Hair loss 12/20/2015  . Dysmenorrhea 09/27/2015  . Class 3 severe obesity due to excess calories with body mass index (BMI) of 40.0 to 44.9 in adult ([place]) 09/27/2015  . MALAISE AND FATIGUE 06/27/2011   Past Surgical History:  Procedure Laterality Date  . BREAST SURGERY  '84  . CESAREAN SECTION  '92, '01   x's 2   No family history on file.  Outpatient Medications Prior to Visit  Medication Sig Dispense Refill  . Vitamin D,  Ergocalciferol, (DRISDOL) 1.25 MG (50000 UNIT) CAPS capsule Take 50,000 Units by mouth every 7 (seven) days.    . celecoxib (CELEBREX) 200 MG capsule One to 2 tablets by mouth daily as needed for pain. (Patient not taking: Reported on 03/25/2021) 60 capsule 2  . dexamethasone (DECADRON) 2 MG tablet Take 3 tablets the first day, 2 the second and 1 the third day (Patient not taking: Reported on 03/25/2021) 6 tablet 0  . promethazine (PHENERGAN) 25 MG suppository Place 1 suppository (25 mg total) rectally every 6 (six) hours as needed for nausea or vomiting. (Patient not taking: Reported on 03/25/2021) 12 each 1   No facility-administered medications prior to visit.   Allergies  Allergen Reactions  . Other Other (See Comments)    Red meat- abdominal pain    Objective:   Today's Vitals   03/25/21 0810  BP: 118/76  Pulse: (!) 57  Temp: 97.6 F (36.4 C)  TempSrc: Temporal  SpO2: 97%  Weight: 198 lb 6.4 oz (90 kg)  Height: 5\' 7"  (1.702 m)   Body mass index is 31.07 kg/m.   General: Well developed, well nourished. No acute distress. Chest: There is a noticeable enlargement of the right SCJ. This feels bony on palpation. There is no tenderness with light to moderate   palpation. No redness or induration in this area. The rest of the right clavicle appears normal. Skin: Warm and dry. No rash noted over  the left mid back. A small flat, skin-colored mole is noted in this area. Psych: Alert and oriented. Normal mood and affect.  Health Maintenance Due  Topic Date Due  . HIV Screening  Never done  . MAMMOGRAM  10/18/2020     Assessment & Plan:   1. Sternoclavicular joint pain, right I suspect that this may be osteoarthritis of the right SCJ. I will send her for an x-ray of the joint to confirm the diagnosis. I will plan to follow-up with her by phone when the x-ray report is available.  - DG Clavicle Right; Future  2. Pruritic condition Provided reassurance that there is no visible  rash, short of a small benign mole. Recommend OTC 1% hydrocortisone cream as needed.  Haydee Salter, MD

## 2021-03-25 NOTE — Addendum Note (Signed)
Addended by: Haydee Salter on: 03/25/2021 03:30 PM   Modules accepted: Orders

## 2021-04-02 ENCOUNTER — Encounter: Payer: Self-pay | Admitting: Family Medicine

## 2021-05-06 ENCOUNTER — Other Ambulatory Visit: Payer: Self-pay

## 2021-05-06 ENCOUNTER — Ambulatory Visit (INDEPENDENT_AMBULATORY_CARE_PROVIDER_SITE_OTHER): Payer: Managed Care, Other (non HMO) | Admitting: Family Medicine

## 2021-05-06 ENCOUNTER — Encounter: Payer: Self-pay | Admitting: Family Medicine

## 2021-05-06 VITALS — BP 122/80 | HR 63 | Temp 97.6°F | Ht 67.0 in | Wt 198.4 lb

## 2021-05-06 DIAGNOSIS — M25511 Pain in right shoulder: Secondary | ICD-10-CM

## 2021-05-06 NOTE — Progress Notes (Signed)
  [facility] [address] [place] [address] Dept: [phone] Dept Fax: [phone]  Office Visit  Subjective:    Patient ID: Emily [name], female    DOB: October 15, 1966, 55 y.o..   MRN: [id number]  Chief Complaint  Patient presents with  . Follow-up    F/u collar bone.      History of Present Illness:  Patient is in today for reassessment of her right sternoclavicular joint pain and enlargement. She initially noted this in mid March. She has been using Celebrex and heat to this joint for the past 5-6 weeks without improvement. She does not note that this has enlarged any more. She denies any known past history of trauma.  Past Medical History: Patient Active Problem List   Diagnosis Date Noted  . Localized swelling of left lower leg 05/09/2020  . Essential hypertension 05/09/2020  . Pain in left ankle and joints of left foot 03/27/2020  . Adenomatous colon polyp 11/03/2019  . Labyrinthitis 05/07/2018  . Healthcare maintenance 05/07/2018  . Degenerative disc disease, cervical 05/07/2018  . Bilateral impacted cerumen 04/15/2018  . Left ear pain 04/15/2018  . Vertigo 04/15/2018  . Hair loss 12/20/2015  . Dysmenorrhea 09/27/2015  . Class 3 severe obesity due to excess calories with body mass index (BMI) of 40.0 to 44.9 in adult ([place]) 09/27/2015  . MALAISE AND FATIGUE 06/27/2011   Past Surgical History:  Procedure Laterality Date  . BREAST SURGERY  '84  . CESAREAN SECTION  '92, '01   x's 2   No family history on file.  Outpatient Medications Prior to Visit  Medication Sig Dispense Refill  . Vitamin D, Ergocalciferol, (DRISDOL) 1.25 MG (50000 UNIT) CAPS capsule Take 50,000 Units by mouth every 7 (seven) days.     No facility-administered medications prior to visit.   Allergies  Allergen Reactions  . Other Other (See Comments)    Red meat- abdominal pain    Objective:   Today's Vitals   05/06/21 0810   BP: 122/80  Pulse: 63  Temp: 97.6 F (36.4 C)  TempSrc: Temporal  SpO2: 97%  Weight: 198 lb 6.4 oz (90 kg)  Height: 5\' 7"  (1.702 m)   Body mass index is 31.07 kg/m.   General: Well developed, well nourished. No acute distress. Chest: There is noticeable enlargement of the right sternoclavicular joint. No redness noted. Minimal discomfort with palpation. Psych: Alert and oriented. Normal mood and affect.  Health Maintenance Due  Topic Date Due  . HIV Screening  Never done  . MAMMOGRAM  10/18/2020    Imaging: Right Clavicle x-ray (03/25/2021) IMPRESSION: Mild to moderate acromioclavicular osteoarthritis. The exam is otherwise negative.    Assessment & Plan:   1. Sternoclavicular joint pain, right The enlargement of the right sternoclavicular joint persists. Plain film x-ray was not able to adequately visualize the joint. The patient's insurance had denied coverage for a CT scan without a 6 week trial of conservative therapy. She has not responded to 6 weeks of NSAIDs with heat at this point. I will proceed with re-ordering the CT scan.  - CT CHEST LIMITED WO CONTRAST; Future  Haydee Salter, MD

## 2021-05-16 ENCOUNTER — Other Ambulatory Visit: Payer: Self-pay

## 2021-05-16 MED ORDER — VITAMIN D (ERGOCALCIFEROL) 1.25 MG (50000 UNIT) PO CAPS: 50000.0000 [IU] | ORAL_CAPSULE | ORAL | 0 refills | Status: DC

## 2021-05-28 ENCOUNTER — Ambulatory Visit
Admission: RE | Admit: 2021-05-28 | Discharge: 2021-05-28 | Disposition: A | Discharge status: Home / Self Care | Payer: Managed Care, Other (non HMO) | Source: Ambulatory Visit | Attending: Family Medicine | Admitting: Family Medicine

## 2021-05-28 DIAGNOSIS — M25511 Pain in right shoulder: Secondary | ICD-10-CM

## 2021-05-29 ENCOUNTER — Telehealth: Payer: Self-pay | Admitting: Family Medicine

## 2021-05-29 NOTE — Telephone Encounter (Signed)
Pt would like a cb concerning her most recent lab results. Please advise at [phone].

## 2021-05-29 NOTE — Telephone Encounter (Signed)
Returned patients call who was calling to speak with Dr. Gena [name] to go over CXR results. Patient verbally understood Dr. Gena [name] out of the office until next week but I would send the message to one of the other Providers to view and give her a call back. Per patient she would only like to speak with Dr. Gena [name] regarding this since he ordered the xray patient would like to wait until Dr. Gena [name] returns to go over results.

## 2021-06-05 ENCOUNTER — Telehealth: Payer: Self-pay

## 2021-06-05 NOTE — Telephone Encounter (Signed)
Pt calling back today wanted to know results of CT scan, pt insists on speaking with Dr. Gena Fray about the results.  I informed pt that today is Dr. Juliane Lack  first day back in office and that I would send him this message so he can call her back with results.  Pt would like a phone call from Dr. Gena Fray only and not a message via [place].  Please advise.

## 2021-06-06 ENCOUNTER — Telehealth: Payer: Self-pay | Admitting: Family Medicine

## 2021-06-06 ENCOUNTER — Encounter: Payer: Self-pay | Admitting: Family Medicine

## 2021-06-06 DIAGNOSIS — M19011 Primary osteoarthritis, right shoulder: Secondary | ICD-10-CM | POA: Insufficient documentation

## 2021-06-06 DIAGNOSIS — I700 Atherosclerosis of aorta: Secondary | ICD-10-CM | POA: Insufficient documentation

## 2021-06-06 NOTE — Telephone Encounter (Signed)
Discussed results with Emily [name]. Answered her questions and concerns regarding CT results. I will see her in the office int he near future to address 2nd prevention of ASCVD.  Haydee Salter, MD

## 2021-06-11 ENCOUNTER — Encounter: Payer: Self-pay | Admitting: Family Medicine

## 2021-06-11 ENCOUNTER — Ambulatory Visit (INDEPENDENT_AMBULATORY_CARE_PROVIDER_SITE_OTHER): Payer: Managed Care, Other (non HMO) | Admitting: Family Medicine

## 2021-06-11 ENCOUNTER — Other Ambulatory Visit: Payer: Self-pay

## 2021-06-11 VITALS — BP 122/76 | HR 81 | Temp 97.2°F | Ht 67.0 in | Wt 195.4 lb

## 2021-06-11 DIAGNOSIS — I700 Atherosclerosis of aorta: Secondary | ICD-10-CM

## 2021-06-11 DIAGNOSIS — H6123 Impacted cerumen, bilateral: Secondary | ICD-10-CM | POA: Diagnosis not present

## 2021-06-11 DIAGNOSIS — E669 Obesity, unspecified: Secondary | ICD-10-CM

## 2021-06-11 DIAGNOSIS — N3281 Overactive bladder: Secondary | ICD-10-CM | POA: Insufficient documentation

## 2021-06-11 DIAGNOSIS — E559 Vitamin D deficiency, unspecified: Secondary | ICD-10-CM

## 2021-06-11 DIAGNOSIS — C4401 Basal cell carcinoma of skin of lip: Secondary | ICD-10-CM | POA: Insufficient documentation

## 2021-06-11 DIAGNOSIS — E785 Hyperlipidemia, unspecified: Secondary | ICD-10-CM | POA: Diagnosis not present

## 2021-06-11 DIAGNOSIS — D126 Benign neoplasm of colon, unspecified: Secondary | ICD-10-CM

## 2021-06-11 NOTE — Progress Notes (Signed)
[facility] [address] [place] [address] Dept: [phone] Dept Fax: [phone]  Transfer of Care Office Visit  Subjective:    Patient ID: Keyle Doby, female    DOB: 1966/07/28, 55 y.o..   MRN: [id number]  Chief Complaint  Patient presents with   Establish Care    [facility]- establish care.  Wants her ears checked out for ear was build up.     History of Present Illness:  Patient is in today to establish care. Ms. Bachmann is originally from [facility] [place], [place]. Her family moved around a great deal as she was growing up. However, she lived longer stretches in [place], [place] and [place], [place]. She attended college at [employer] and has a BS in biology, with a minor in Teacher, music.  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 She has been married for almost 30 years. She has 4 children. Her youngest is 71 and a Equities trader at [employer]. She denies tobacco, alcohol, or drug use.  Ms. Waymire had a recent CT scan that showed some aortic atherosclerosis. She does have a family history of a MGF with "hardening of the arteries" and her mother has had a coronary stent placed. Ms. Bachicha does not use tobacco. She has been participating in Weight Watchers to help her lose weight and has lst 70 lbs so far. She does have a history of borderline elevations in her lipids.  Ms. Jelinski has a history of an adenomatous polyp, found on a colonoscopy in 2016. The report indicates she should be on a every 5 year cycle.  Ms. Mcneish has a history of recurrent episodes of vertigo that she developed in 2021. She was seen by multiple specialists and a physical therapist specializing in vertigo, none of which were able to resolve her issue. She was taking Myrbetriq (mirabegron) at that time for overactive bladder issues. She was suspicious for this being the  underlying issue, despite vertigo not being a described side effect. She stopped this medication and had complete resolution of her issue. She does feel that she gets ear wax issues at times and uses drops to keep the wax soft. She worries about the wax beign a trigger for her vertigo.  Ms. Hockenbury has recently been identified as having osteoarthritis of the right SCJ. She had preior arthritis issues in her neck.  Ms. Pizzimenti has a history of Vitamin D deficiency. She has been on a replacement dose of Vitamin D.  Past Medical History: Patient Active Problem List   Diagnosis Date Noted   Borderline hyperlipidemia 06/11/2021   Vitamin D deficiency 06/11/2021   Osteoarthritis of sternoclavicular joint, right 06/06/2021   Aortic atherosclerosis ([place]) 06/06/2021   Adenomatous colon polyp 11/03/2019   Labyrinthitis 05/07/2018   Degenerative disc disease, cervical 05/07/2018   Bilateral impacted cerumen 04/15/2018   Vertigo 04/15/2018   Dysmenorrhea 09/27/2015   Obesity (BMI 30.0-34.9) 09/27/2015   Past Surgical History:  Procedure Laterality Date   BREAST SURGERY  '84   CESAREAN SECTION  '92, '01   x's 2   Family History  Problem Relation Age of Onset   Heart disease Mother    Arthritis Mother    Arthritis Brother    Heart disease Maternal Grandfather    Heart disease Paternal Grandmother    Cancer Paternal Grandfather        Colon  Outpatient Medications Prior to Visit  Medication Sig Dispense Refill   Vitamin D, Ergocalciferol, (DRISDOL) 1.25 MG (50000 UNIT) CAPS capsule Take 1 capsule (50,000 Units total) by mouth every 7 (seven) days. 5 capsule 0   No facility-administered medications prior to visit.   Allergies  Allergen Reactions   Other Other (See Comments)    Red meat- abdominal pain    Objective:   Today's Vitals   06/11/21 1327  BP: 122/76  Pulse: 81  Temp: (!) 97.2 F (36.2 C)  TempSrc: Temporal  SpO2: 98%  Weight: 195 lb 6.4 oz (88.6 kg)  Height: 5' 7"   (1.702 m)   Body mass index is 30.6 kg/m.   General: Well developed, well nourished. No acute distress. HEENT: Normocephalic, non-traumatic. Soft wax partially obscuring both ear canals, readily removed with   an ear currette.  Psych: Alert and oriented. Normal mood and affect.  Health Maintenance Due  Topic Date Due   HIV Screening  Never done   Zoster Vaccines- Shingrix (1 of 2) Never done   COVID-19 Vaccine (3 - Booster for Janssen series) 02/25/2021    Lab results: Lab Results  Component Value Date   CHOL 233 (H) 05/11/2020   HDL 52.10 05/11/2020   LDLCALC 153 (H) 05/11/2020   LDLDIRECT 139.0 05/11/2020   TRIG 139.0 05/11/2020   CHOLHDL 4 05/11/2020     Imaging: CT of Chest Limited w/o contrast (05/28/2021) IMPRESSION: Asymmetric degenerative changes of the RIGHT sternoclavicular joint with associated synovial thickening/hypertrophy.   Minimal degenerative changes of the AC joints bilaterally.   Aortic Atherosclerosis (ICD10-I70.0). Assessment & Plan:   1. Aortic atherosclerosis (HCC) We discussed the implications of this finding on her chest CT. She is not a smoker and has been recently working on weight loss. Remaining active will be an important aspect of reducing risk of progression. We discussed her lipids and the role they may play in this.  2. Bilateral impacted cerumen Small of amount of wax removed.  3. Obesity (BMI 30.0-34.9) Significant reduction in weight and BMI with current adherence to Weight Watchers.  4. Borderline hyperlipidemia She will return for fasting lipids. Her ACC/AHA cardiac risk based on last year's values is 2.7%. We will consider whether or not a statin may be useful based on her current values, when available.  - Lipid panel; Future  5. Vitamin D deficiency We will repeat her Vitamin D level. If normal, she can switch to an OTC mntenace dose.  - VITAMIN D 25 Hydroxy (Vit-D Deficiency, Fractures); Future  Haydee Salter, MD

## 2021-06-12 ENCOUNTER — Other Ambulatory Visit: Payer: Managed Care, Other (non HMO)

## 2021-06-19 ENCOUNTER — Other Ambulatory Visit: Payer: Self-pay

## 2021-06-19 ENCOUNTER — Other Ambulatory Visit (INDEPENDENT_AMBULATORY_CARE_PROVIDER_SITE_OTHER): Payer: Managed Care, Other (non HMO)

## 2021-06-19 DIAGNOSIS — E785 Hyperlipidemia, unspecified: Secondary | ICD-10-CM

## 2021-06-19 DIAGNOSIS — E559 Vitamin D deficiency, unspecified: Secondary | ICD-10-CM | POA: Diagnosis not present

## 2021-06-19 LAB — LIPID PANEL
Cholesterol: 211 mg/dL — ABNORMAL HIGH (ref 0–200)
HDL: 50.9 mg/dL (ref >39.00)
LDL Cholesterol: 139 mg/dL — ABNORMAL HIGH (ref 0–99)
NonHDL: 160.54
Total CHOL/HDL Ratio: 4
Triglycerides: 106 mg/dL (ref 0.0–149.0)
VLDL: 21.2 mg/dL (ref 0.0–40.0)

## 2021-06-19 LAB — VITAMIN D 25 HYDROXY (VIT D DEFICIENCY, FRACTURES): VITD: 40.1 ng/mL (ref 30.00–100.00)

## 2021-06-19 NOTE — Progress Notes (Signed)
Per orders of Dr. Gena Fray pt is here for labs pt tolerated draw well.

## 2021-06-20 ENCOUNTER — Other Ambulatory Visit: Payer: Managed Care, Other (non HMO)

## 2021-07-29 ENCOUNTER — Ambulatory Visit (INDEPENDENT_AMBULATORY_CARE_PROVIDER_SITE_OTHER): Payer: Managed Care, Other (non HMO) | Admitting: Neurology

## 2021-07-29 ENCOUNTER — Encounter: Payer: Self-pay | Admitting: Neurology

## 2021-07-29 ENCOUNTER — Other Ambulatory Visit: Payer: Self-pay

## 2021-07-29 VITALS — BP 118/76 | HR 60 | Ht 67.0 in | Wt 192.0 lb

## 2021-07-29 DIAGNOSIS — R42 Dizziness and giddiness: Secondary | ICD-10-CM | POA: Diagnosis not present

## 2021-07-29 NOTE — Progress Notes (Signed)
Reason for visit: Vertigo  Emily [name] is an 55 y.o. female  History of present illness:  Emily [name] is a 55 year old right-handed white female with a history of episodic vertigo.  The patient has had episodes since 2019, the last event was in July 2019.  The patient has not had any events since that time.  She has gone off of Myrbetriq thinking that this was the source of her issues.  She denies headaches.  She denies any significant problems with balance or staggering.  She has not had any hearing changes.  She returns to this office for further evaluation.  Past Medical History:  Diagnosis Date   Chicken pox    Colon polyp    Genital warts    GERD (gastroesophageal reflux disease)    Meningitis spinal 2012   Overactive bladder    Vertigo     Past Surgical History:  Procedure Laterality Date   BREAST SURGERY  '84   CESAREAN SECTION  '92, '01   x's 2    Family History  Problem Relation Age of Onset   Heart disease Mother    Arthritis Mother    Arthritis Brother    Heart disease Maternal Grandfather    Heart disease Paternal Grandmother    Cancer Paternal Grandfather        Colon    Social history:  reports that she has never smoked. She has never used smokeless tobacco. She reports previous alcohol use. She reports that she does not use drugs.    Allergies  Allergen Reactions   Other Other (See Comments)    Red meat- abdominal pain    Medications:  Prior to Admission medications   Medication Sig Start Date End Date Taking? Authorizing Provider  Vitamin D, Ergocalciferol, (DRISDOL) 1.25 MG (50000 UNIT) CAPS capsule Take 1 capsule (50,000 Units total) by mouth every 7 (seven) days. 05/16/21   Haydee Salter, MD    ROS:  Out of a complete 14 system review of symptoms, the patient complains only of the following symptoms, and all other reviewed systems are negative.  Episodic vertigo  Blood pressure 118/76, pulse 60, height '5\' 7"'$  (1.702 m), weight 192  lb (87.1 kg), last menstrual period 03/29/2016.  Physical Exam  General: The patient is alert and cooperative at the time of the examination.  Ears: Tympanic membrane is clear bilaterally, some cerumen is seen on the right.  Skin: No significant peripheral edema is noted.   Neurologic Exam  Mental status: The patient is alert and oriented x 3 at the time of the examination. The patient has apparent normal recent and remote memory, with an apparently normal attention span and concentration ability.   Cranial nerves: Facial symmetry is present. Speech is normal, no aphasia or dysarthria is noted. Extraocular movements are full. Visual fields are full.  Motor: The patient has good strength in all 4 extremities.  Sensory examination: Soft touch sensation is symmetric on the face, arms, and legs.  Coordination: The patient has good finger-nose-finger and heel-to-shin bilaterally.  Gait and station: The patient has a normal gait. Tandem gait is slightly unsteady.  Romberg is negative. No drift is seen.  Reflexes: Deep tendon reflexes are symmetric.   Assessment/Plan:  1.  Episodic vertigo  The patient is done well since last seen.  I am not clear that the Myrbetriq is a source of her vertigo, but the patient believes that she has done better off the medication.  She will follow-up  here on an as-needed basis, she will call if she has any recurrence of symptoms.  Jill Alexanders MD 07/29/2021 8:06 AM  [facility] [address] [place], [place] [address]  Phone [phone] Fax [phone]

## 2021-12-10 ENCOUNTER — Other Ambulatory Visit: Payer: Self-pay

## 2021-12-11 ENCOUNTER — Ambulatory Visit (INDEPENDENT_AMBULATORY_CARE_PROVIDER_SITE_OTHER): Payer: Managed Care, Other (non HMO) | Admitting: Family Medicine

## 2021-12-11 ENCOUNTER — Encounter: Payer: Self-pay | Admitting: Family Medicine

## 2021-12-11 VITALS — BP 118/76 | HR 75 | Temp 97.2°F | Ht 67.0 in | Wt 187.0 lb

## 2021-12-11 DIAGNOSIS — E559 Vitamin D deficiency, unspecified: Secondary | ICD-10-CM

## 2021-12-11 DIAGNOSIS — H6123 Impacted cerumen, bilateral: Secondary | ICD-10-CM

## 2021-12-11 DIAGNOSIS — D126 Benign neoplasm of colon, unspecified: Secondary | ICD-10-CM

## 2021-12-11 LAB — VITAMIN D 25 HYDROXY (VIT D DEFICIENCY, FRACTURES): VITD: 22.32 ng/mL — ABNORMAL LOW (ref 30.00–100.00)

## 2021-12-11 MED ORDER — VITAMIN D (ERGOCALCIFEROL) 1.25 MG (50000 UNIT) PO CAPS: 50000.0000 [IU] | ORAL_CAPSULE | ORAL | 1 refills | Status: DC

## 2021-12-11 NOTE — Addendum Note (Signed)
Addended by: Haydee Salter on: 12/11/2021 06:12 PM   Modules accepted: Orders

## 2021-12-11 NOTE — Progress Notes (Signed)
[facility] [address] [place] [address] Dept: [phone] Dept Fax: [phone]  Chronic Care Office Visit  Subjective:    Patient ID: Emily [name], female    DOB: [birth year], 55 y.o..   MRN: [id number]  Chief Complaint  Patient presents with   Follow-up    6 month f/u.      History of Present Illness:  Patient is in today for reassessment of chronic medical issues.  Emily [name] has a history of Vitamin D deficiency. She had been on a replacement dose of Vitamin D. Her last level was normal, so we switched her to a maintenance dose. She is due for follow-up to make sure she is maintaining with Vitamin D in her multivitamin.  Emily [name] has a history of an adenomatous polyp, found on a colonoscopy in 2016. The report indicates she should be on a every 5 year cycle.   Emily [name] has noted some recent sense of fullness int he ears and believes she may have impacted wax.  Past Medical History: Patient Active Problem List   Diagnosis Date Noted   Borderline hyperlipidemia 06/11/2021   Vitamin D deficiency 06/11/2021   Overactive bladder 06/11/2021   Basal cell carcinoma of lip 06/11/2021   Osteoarthritis of sternoclavicular joint, right 06/06/2021   Aortic atherosclerosis ([place]) 06/06/2021   Adenomatous colon polyp 11/03/2019   Labyrinthitis 05/07/2018   Degenerative disc disease, cervical 05/07/2018   Bilateral impacted cerumen 04/15/2018   Vertigo 04/15/2018   Dysmenorrhea 09/27/2015   Obesity (BMI 30.0-34.9) 09/27/2015   Past Surgical History:  Procedure Laterality Date   BREAST SURGERY  '84   CESAREAN SECTION  '92, '01   x's 2   Family History  Problem Relation Age of Onset   Heart disease Mother    Arthritis Mother    Arthritis Brother    Heart disease Maternal Grandfather    Heart disease Paternal Grandmother    Cancer Paternal Grandfather        Colon   Outpatient Medications Prior to Visit   Medication Sig Dispense Refill   Multiple Vitamin (MULTIVITAMIN WITH MINERALS) TABS tablet Take 1 tablet by mouth daily.     No facility-administered medications prior to visit.   Allergies  Allergen Reactions   Other Other (See Comments)    Red meat- abdominal pain    Objective:   Today's Vitals   12/11/21 0800  BP: 118/76  Pulse: 75  Temp: (!) 97.2 F (36.2 C)  TempSrc: Temporal  SpO2: 96%  Weight: 187 lb (84.8 kg)  Height: 5\' 7"  (1.702 m)   Body mass index is 29.29 kg/m.   General: Well developed, well nourished. No acute distress. Ears: The right EAC has impacted wax. The left, has a small amount of wax near   the external auditory meatus. Psych: Alert and oriented. Normal mood and affect.  Health Maintenance Due  Topic Date Due   Pneumococcal Vaccine 19-78 Years old (1 - PCV) Never done   HIV Screening  Never done   Zoster Vaccines- Shingrix (1 of 2) Never done   COLONOSCOPY (Pts 45-71yrs Insurance coverage will need to be confirmed)  12/30/2019   COVID-19 Vaccine (3 - Booster for Janssen series) 12/23/2020   PAP SMEAR-Modifier  10/18/2021   Lab Results Last vitamin D Lab Results  Component Value Date   VD25OH 40.10 06/19/2021   PROCEDURE- Ear Wax Removal Indication: Impacted ear wax  PARQ reviewed with patient. Verbal consent obtained. Lighted  curette used to remove wax. Patient tolerated with mild discomfort.  Assessment & Plan:   1. Vitamin D deficiency Due for repeat Vitamin D to make sure this remains in normal range.  - VITAMIN D 25 Hydroxy (Vit-D Deficiency, Fractures)  2. Adenomatous polyp of colon, unspecified part of colon Reviewed Emily [name]'s prior colonoscopies with findings of an adenomatous polyp. She is recommended for follow-up in 5 years (09/2024).   3. Bilateral impacted cerumen Removed ear wax form both ears. May consider using OTC ear wax drops to help keep her canals clear.  Haydee Salter, MD

## 2022-01-07 ENCOUNTER — Telehealth: Payer: Self-pay | Admitting: Family Medicine

## 2022-01-07 DIAGNOSIS — E559 Vitamin D deficiency, unspecified: Secondary | ICD-10-CM

## 2022-01-07 MED ORDER — VITAMIN D (ERGOCALCIFEROL) 1.25 MG (50000 UNIT) PO CAPS: 50000.0000 [IU] | ORAL_CAPSULE | ORAL | 5 refills | Status: DC

## 2022-01-07 NOTE — Telephone Encounter (Signed)
Patient notified VIA phone and RX went to the pharmacy.  No further questions.  Dm/cma

## 2022-02-04 LAB — RESULTS CONSOLE HPV: CHL HPV: NEGATIVE

## 2022-02-04 LAB — HM MAMMOGRAPHY

## 2022-02-05 LAB — HM PAP SMEAR

## 2022-02-06 ENCOUNTER — Encounter: Payer: Self-pay | Admitting: Family Medicine

## 2022-02-12 ENCOUNTER — Encounter: Payer: Self-pay | Admitting: Family Medicine

## 2022-06-24 ENCOUNTER — Encounter: Payer: Managed Care, Other (non HMO) | Admitting: Family Medicine

## 2022-07-10 ENCOUNTER — Encounter: Payer: Managed Care, Other (non HMO) | Admitting: Family Medicine

## 2022-07-24 ENCOUNTER — Encounter: Payer: Self-pay | Admitting: Family Medicine

## 2022-07-24 ENCOUNTER — Ambulatory Visit (INDEPENDENT_AMBULATORY_CARE_PROVIDER_SITE_OTHER): Payer: Managed Care, Other (non HMO) | Admitting: Family Medicine

## 2022-07-24 VITALS — BP 124/78 | HR 71 | Temp 97.6°F | Ht 67.0 in | Wt 193.6 lb

## 2022-07-24 DIAGNOSIS — Z23 Encounter for immunization: Secondary | ICD-10-CM

## 2022-07-24 DIAGNOSIS — E785 Hyperlipidemia, unspecified: Secondary | ICD-10-CM

## 2022-07-24 DIAGNOSIS — E559 Vitamin D deficiency, unspecified: Secondary | ICD-10-CM

## 2022-07-24 DIAGNOSIS — Z0000 Encounter for general adult medical examination without abnormal findings: Secondary | ICD-10-CM | POA: Diagnosis not present

## 2022-07-24 DIAGNOSIS — N3281 Overactive bladder: Secondary | ICD-10-CM

## 2022-07-24 DIAGNOSIS — H9319 Tinnitus, unspecified ear: Secondary | ICD-10-CM

## 2022-07-24 LAB — LIPID PANEL
Cholesterol: 203 mg/dL — ABNORMAL HIGH (ref 0–200)
HDL: 53.4 mg/dL (ref >39.00)
LDL Cholesterol: 129 mg/dL — ABNORMAL HIGH (ref 0–99)
NonHDL: 150.03
Total CHOL/HDL Ratio: 4
Triglycerides: 106 mg/dL (ref 0.0–149.0)
VLDL: 21.2 mg/dL (ref 0.0–40.0)

## 2022-07-24 LAB — VITAMIN D 25 HYDROXY (VIT D DEFICIENCY, FRACTURES): VITD: 33.12 ng/mL (ref 30.00–100.00)

## 2022-07-24 MED ORDER — VITAMIN D (ERGOCALCIFEROL) 1.25 MG (50000 UNIT) PO CAPS: 50000.0000 [IU] | ORAL_CAPSULE | ORAL | 5 refills | Status: DC

## 2022-07-24 NOTE — Progress Notes (Signed)
[name] [place] [address] Dept: [phone] Dept Fax: [phone]  Annual Physical Visit  Subjective:    Patient ID: Emily [name], female    DOB: 1966-06-07, 56 y.o..   MRN: [id number]  Chief Complaint  Patient presents with   Annual Exam    CPE/labs.  Fasting today.  C/o having ringing in ears    History of Present Illness:  Patient is in today for an annual physical/preventative visit.  Review of Systems  Constitutional:  Negative for chills, fever, malaise/fatigue and weight loss.  HENT:  Positive for hearing loss and tinnitus. Negative for congestion, ear discharge, ear pain, sinus pain and sore throat.        Patient feels frustrated about developing tinnitus. She does not feel like she has had excessive exposure to loud noise/music in her lifetime. She does admit that her hearing seems decreased and indistinct at times.  Eyes:  Negative for blurred vision, pain, discharge and redness.  Respiratory:  Negative for cough, shortness of breath and wheezing.   Cardiovascular:  Negative for chest pain, palpitations and leg swelling.  Gastrointestinal:  Negative for abdominal pain, constipation, diarrhea, heartburn, nausea and vomiting.  Genitourinary:        Has a history of an overactive bladder. Has more recently had a Botox infusion in her bladder, which has helped her symptoms significantly.  Musculoskeletal:  Positive for joint pain. Negative for back pain, myalgias and neck pain.       Mild pain at the base of the left thumb (1st MCP joint)  Skin:  Negative for rash.  Neurological:  Positive for dizziness. Negative for tingling, focal weakness, weakness and headaches.       Has a history of episodic vertigo. She felt that this was related to her Mybetriq use, though she still feels this way at times, even though she has been off of the medication.  Endo/Heme/Allergies:  Negative for environmental  allergies.   Past Medical History: Patient Active Problem List   Diagnosis Date Noted   Borderline hyperlipidemia 06/11/2021   Vitamin D deficiency 06/11/2021   Overactive bladder 06/11/2021   Basal cell carcinoma of lip 06/11/2021   Osteoarthritis of sternoclavicular joint, right 06/06/2021   Aortic atherosclerosis (Emily [name]) 06/06/2021   Adenomatous colon polyp 11/03/2019   Degenerative disc disease, cervical 05/07/2018   Vertigo 04/15/2018   Dysmenorrhea 09/27/2015   Obesity (BMI 30.0-34.9) 09/27/2015   Past Surgical History:  Procedure Laterality Date   bladder botox     BREAST SURGERY  '84   CESAREAN SECTION  '92, '01   x's 2   Family History  Problem Relation Age of Onset   Heart disease Mother    Arthritis Mother    Arthritis Brother    Heart disease Maternal Grandfather    Heart disease Paternal Grandmother    Cancer Paternal Grandfather        Colon   Outpatient Medications Prior to Visit  Medication Sig Dispense Refill   Multiple Vitamin (MULTIVITAMIN WITH MINERALS) TABS tablet Take 1 tablet by mouth daily.     Vitamin D, Ergocalciferol, (DRISDOL) 1.25 MG (50000 UNIT) CAPS capsule Take 1 capsule (50,000 Units total) by mouth every 7 (seven) days. 5 capsule 5   No facility-administered medications prior to visit.   Allergies  Allergen Reactions   Other Other (See Comments)    Red meat- abdominal pain   Objective:   Today's Vitals   07/24/22 0911  BP: 124/78  Pulse: 71  Temp: 97.6 F (36.4 C)  TempSrc: Temporal  SpO2: 98%  Weight: 193 lb 9.6 oz (87.8 kg)  Height: '5\' 7"'$  (1.702 m)   Body mass index is 30.32 kg/m.   General: Well developed, well nourished. No acute distress. HEENT: Normocephalic, non-traumatic. PERRL, EOMI. Conjunctiva clear. External ears normal. EAC and TM   normal on the left. Right EAC is obscured with wax.  Nose clear without congestion or rhinorrhea. Mucous   membranes moist. Oropharynx clear. Good dentition. Neck: Supple. No  lymphadenopathy. No thyromegaly. Lungs: Clear to auscultation bilaterally. No wheezing, rales or rhonchi. CV: RRR without murmurs or rubs. Pulses 2+ bilaterally. Abdomen: Soft, non-tender. Bowel sounds positive, normal pitch and frequency. No hepatosplenomegaly. No   rebound or guarding. Back: Straight. No CVA tenderness bilaterally. Extremities: Full ROM. No joint swelling or tenderness. No edema noted. Skin: Warm and dry. No rashes. Neuro: No focal neurological deficits. Psych: Alert and oriented. Normal mood and affect.  Health Maintenance Due  Topic Date Due   HIV Screening  Never done   Zoster Vaccines- Shingrix (1 of 2) Never done     Assessment & Plan:   1. Annual physical exam Overall good health. Up to date on recommended screenings. Reviewed recommendation for shingles vaccination.  2. Overactive bladder Emily [name] has had good response to botox infusion. She will cotninue to follow with Emily [name] regarding this issue.  3. Borderline hyperlipidemia Due for repeat lipids.  - Lipid panel  4. Vitamin D deficiency Has been on Vitamin D replacement. She has had recurrent insufficiency. I will reassess her level.  - VITAMIN D 25 Hydroxy (Vit-D Deficiency, Fractures) - Vitamin D, Ergocalciferol, (DRISDOL) 1.25 MG (50000 UNIT) CAPS capsule; Take 1 capsule (50,000 Units total) by mouth every 7 (seven) days.  Dispense: 5 capsule; Refill: 5  5. Subjective tinnitus, unspecified laterality I will refer Emily [name] for hearing testing. With her complaint of tinnitus, subjective hearing loss, and history of episodic vertigo, she could potentially have Meniere's disease. I will broach this with her after her audiology results are available. We would then discuss potential approaches to improve these symptoms.  - Ambulatory referral to Audiology  6. Need for shingles vaccine  - Varicella-zoster vaccine IM  Return in about 1 year (around 07/25/2023) for Annual preventative care.    Emily Salter, MD

## 2022-07-31 ENCOUNTER — Ambulatory Visit: Payer: Managed Care, Other (non HMO) | Attending: Audiology | Admitting: Audiology

## 2022-07-31 DIAGNOSIS — H903 Sensorineural hearing loss, bilateral: Secondary | ICD-10-CM | POA: Diagnosis present

## 2022-07-31 DIAGNOSIS — H9313 Tinnitus, bilateral: Secondary | ICD-10-CM | POA: Diagnosis present

## 2022-07-31 NOTE — Procedures (Signed)
Outpatient Audiology and [place], [place]  [address] [phone]  AUDIOLOGICAL  EVALUATION  NAME: Emily [name]     DOB:   [birth year]      MRN: [id number]                                                                                     DATE: 07/31/2022     REFERENT: Haydee Salter, MD STATUS: Outpatient DIAGNOSIS: Sensorineural hearing loss, bilateral, Tinnitus   History: Ifeoluwa was seen for an audiological evaluation due to tinnitus occurring for the past 3 months. Secily reports her tinnitus is worse in quiet and feels the tinnitus is "coming from her head" and is in both ears. Dandria denies otalgia however she does report aural fullness in the right ear. Contina reports she has a history of excessive ear wax. Zyriah reports it was recommended for her to try an at home an Ear Wax Removal WaterPik system. Stacey reports limited success using her Ear Wax Removal WaterPik and reports she continued to have wax problems and feels her right ear is full. Michiah reports increased hearing difficulty. Kellie reports she feels she has decreased hearing in both ears, is asking for repetition, and has had difficulty hearing her children.   Evaluation:  Otoscopy showed  excessive cerumen in the right ear and the tympanic membrane could not be visualized and showed a clear view of the tympanic membrane in the left ear.  Tympanometry results were consistent with normal middle ear pressure and normal tympanic membrane mobility (Type A), bilaterally.  Audiometric testing was completed using Conventional Audiometry techniques with insert earphones and TDH headphones. Test results are consistent with normal hearing sensitivity at [phone] Hz sloping to a mild to moderate sensorineural hearing loss at 4000-8000 Hz, bilaterally. A conductive component is noted at 4000 Hz.  Speech Recognition Thresholds were obtained at 10 dB HL in the right ear and at 10  dB HL in  the left ear. Word Recognition Testing was completed at 70 dB HL and Neko scored 100%, bilaterally.        Results:  Today's results are consistent with normal hearing sensitivity at [phone] Hz sloping to a mild to moderate sensorineural hearing loss at 4000-8000 Hz, bilaterally. A conductive component is noted at 4000 Hz. Tympanometry showed normal middle ear function in both ears. The test results and recommendations were reviewed with Sybilla. Maelee may have difficulty hearing and communicating in adverse listening situations. Yulitza will benefit from the use of good communication strategies. Eladia was given handouts on effective communication strategies. Tinnitus management strategies were reviewed. Aviyana was given a handout on tinnitus management strategies. If Fonnie 's tinnitus worsens then a referral to [facility] was recommended. A referral to an ENT is recommended for cerumen removal, management, and tinnitus.   Recommendations: Referral to [facility] in [place] for cerumen management and tinnitus.  Use of Tinnitus Management Strategies. If tinnitus worsens then Devaney is recommended to schedule and appointment at the [facility] Tinnitus Clinic Telephone: [phone] Monitor Hearing Sensitivity  45 minutes spent testing and counseling on results.   If you have any questions please feel free to contact me at [phone].  Bari Mantis Audiologist, Au.D., CCC-A 07/31/2022  10:01 AM  Cc: Haydee Salter, MD

## 2022-07-31 NOTE — Addendum Note (Signed)
Addended by: Haydee Salter on: 07/31/2022 02:22 PM   Modules accepted: Orders

## 2022-08-18 IMAGING — CT CT CHEST LIMITED W/O CM
1 of 2 series · 15 of 30 positions shown, 19 images · non-contrast
Comparison: RIGHT clavicular radiographs 03/25/2021

CLINICAL DATA: RIGHT sternoclavicular joint large mint and pain for
2 months, no known injury

EXAM:
CT CHEST WITHOUT CONTRAST LIMITED
TECHNIQUE: Multidetector LIMITED CT imaging of the chest was performed
following the standard protocol without IV contrast. Sagittal and
coronal MPR images reconstructed from axial data set.

[Series 6: thin soft · axial · 0.91mm/px · z∈[-88,-24]mm · 15 of 121 slices shown, 19 images]
[im 7/121  mediastinal]
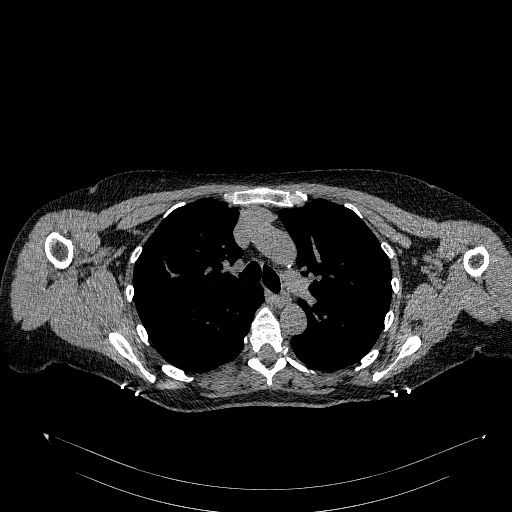
[im 7/121  lung]
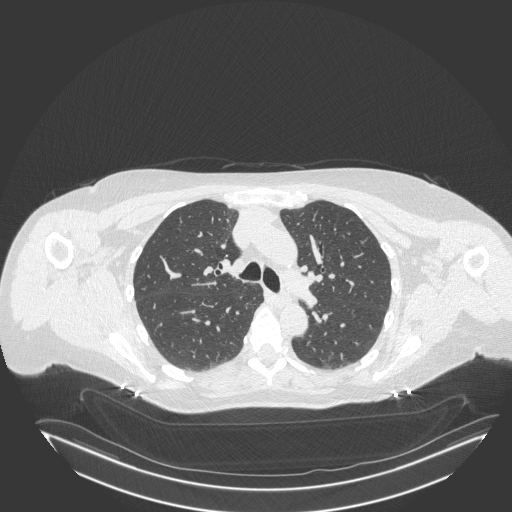
[im 19/121  lung]
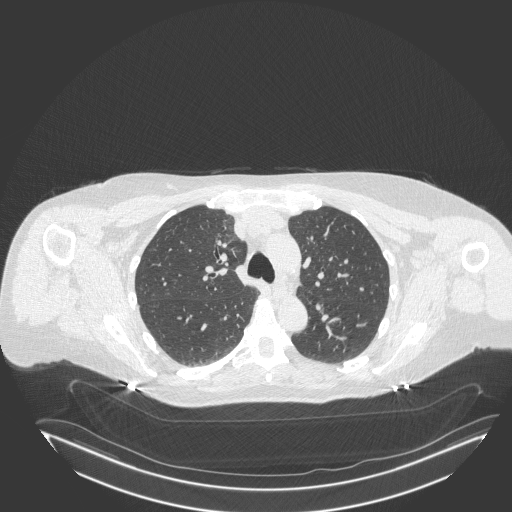
[im 25/121  lung]
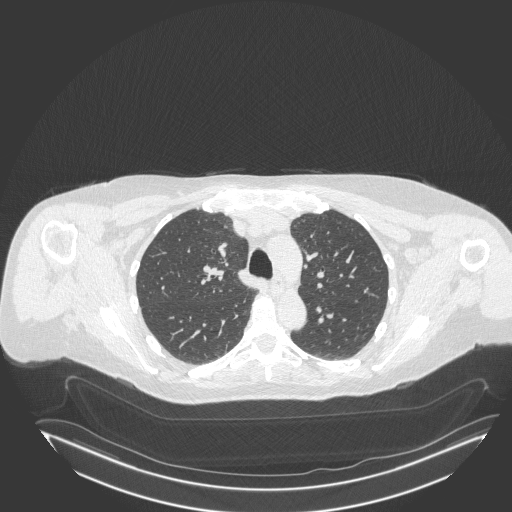
[im 31/121  lung]
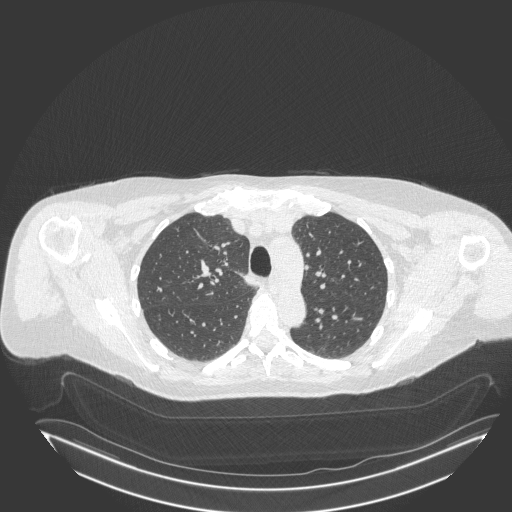
[im 43/121  mediastinal]
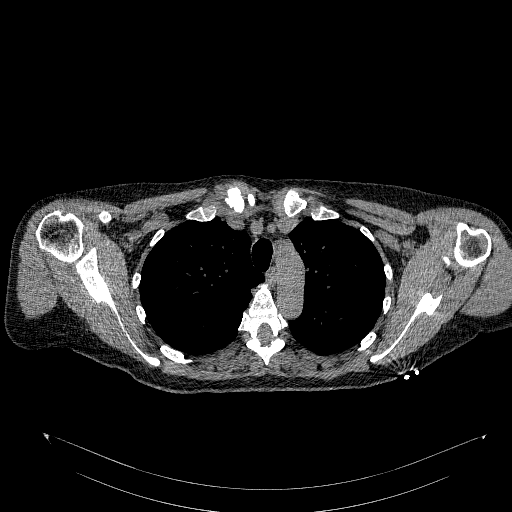
[im 43/121  lung]
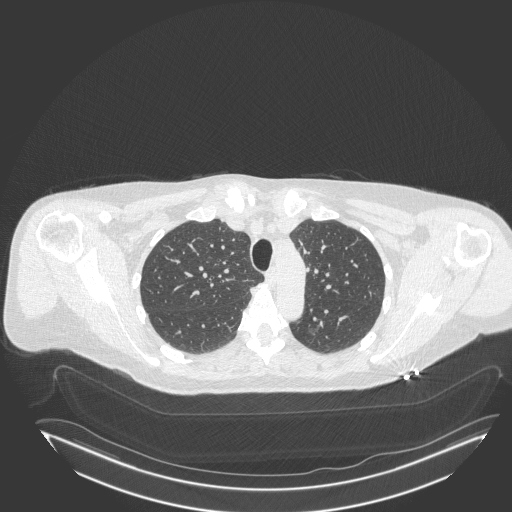
[im 45/121  lung]
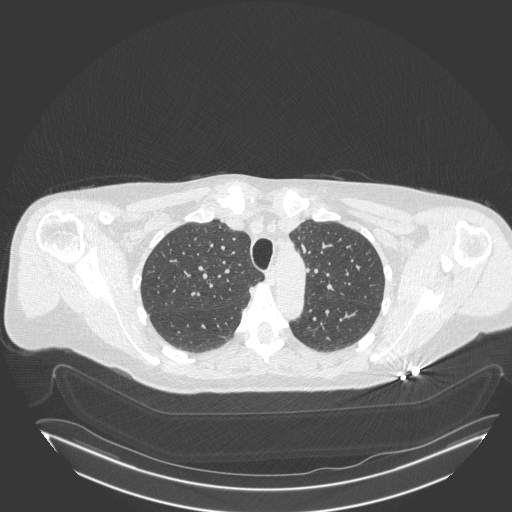
[im 49/121  lung]
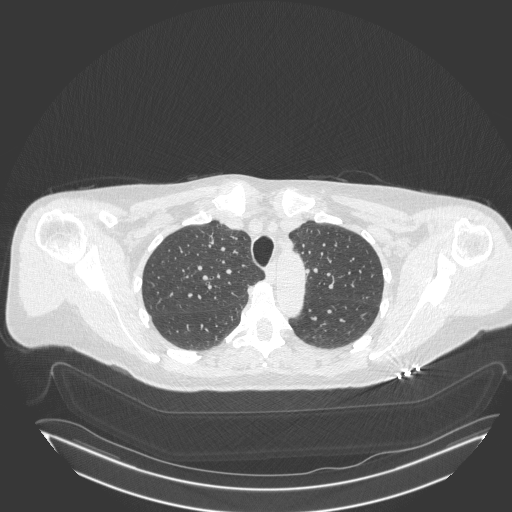
[im 61/121  lung]
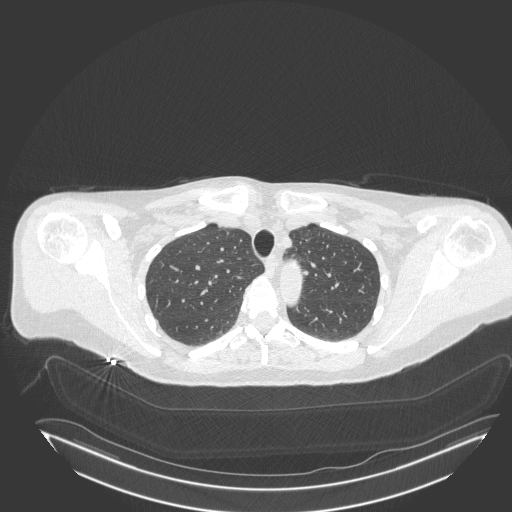
[im 67/121  mediastinal]
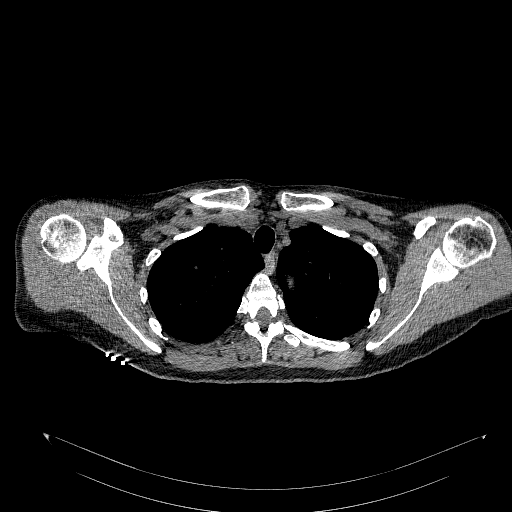
[im 67/121  lung]
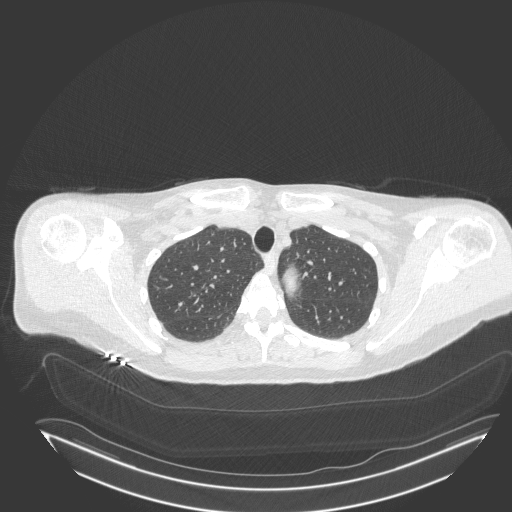
[im 73/121  lung]
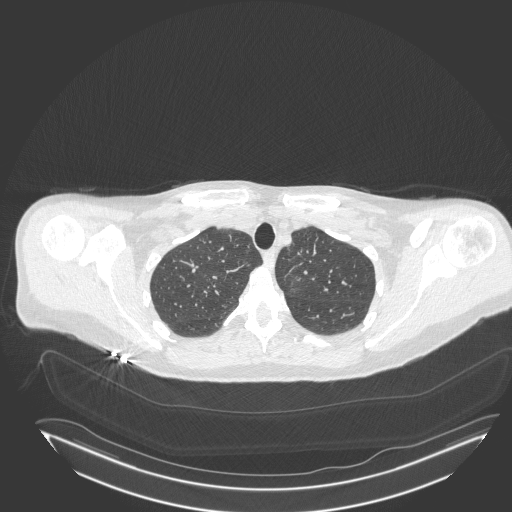
[im 85/121  lung]
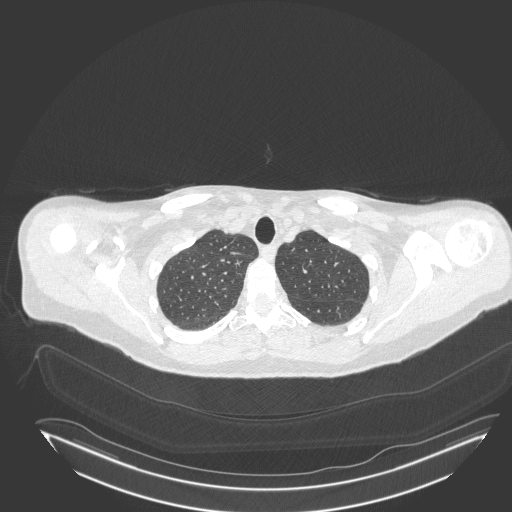
[im 91/121  lung]
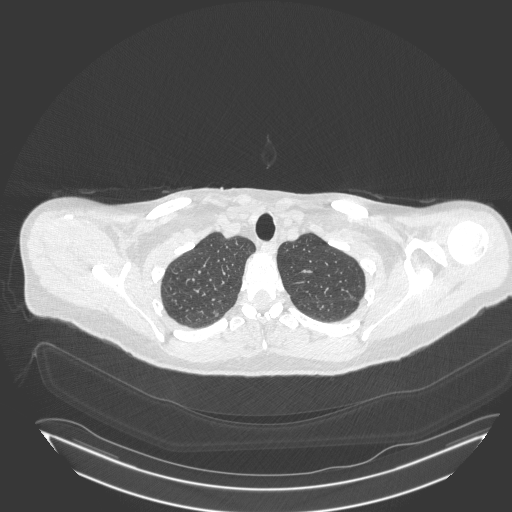
[im 97/121  mediastinal]
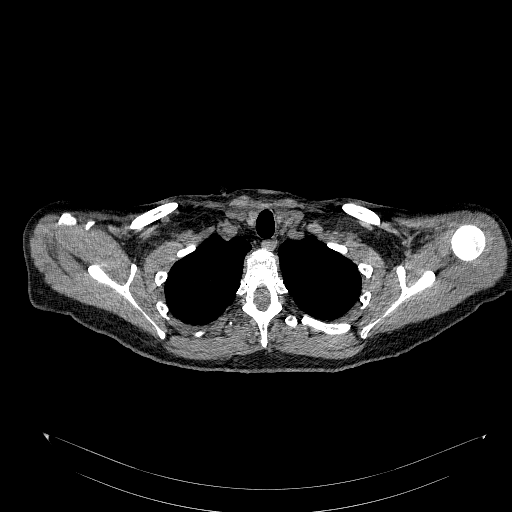
[im 97/121  lung]
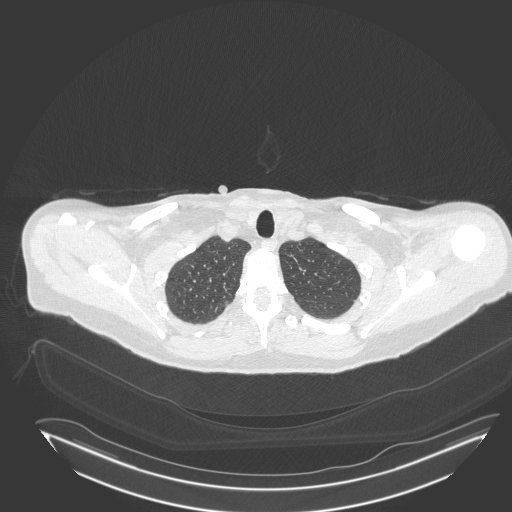
[im 109/121  lung]
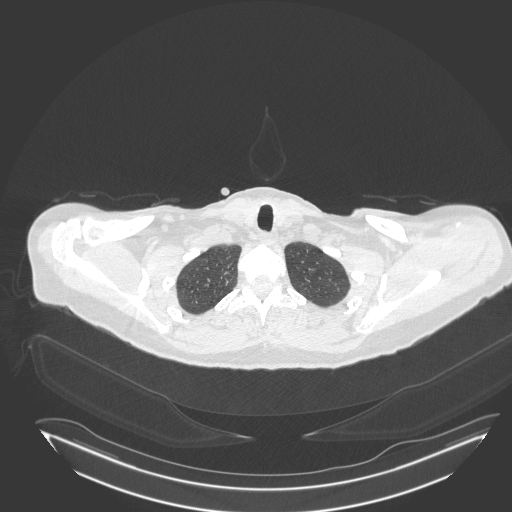
[im 115/121  lung]
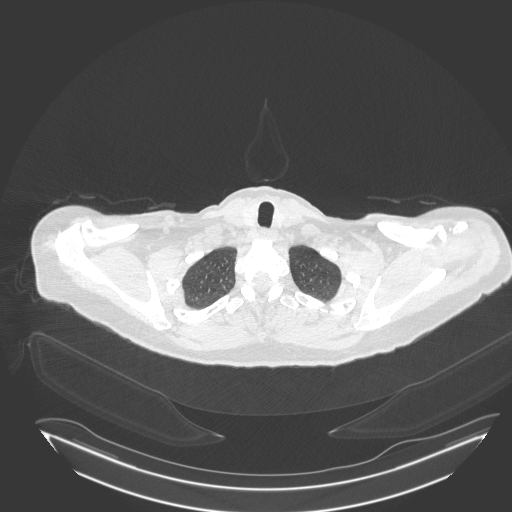

[15 of 30 positions shown; findings below may reference images not displayed]

FINDINGS: Cardiovascular: Atherosclerotic calcification at aortic arch.

Mediastinum/Nodes: Base of cervical region normal. Visualized
esophagus unremarkable. No enlarged lymph nodes seen within the
visualized soft tissues.

Lungs/Pleura: Visualized lungs clear

Musculoskeletal: Minimal degenerative changes of the AC joints
bilaterally. Degenerative RIGHT sternoclavicular joint with joint
space narrowing, subchondral sclerosis, and spur formation.
Associated synovial thickening/hypertrophy.
IMPRESSION: Asymmetric degenerative changes of the RIGHT sternoclavicular joint
with associated synovial thickening/hypertrophy.

Minimal degenerative changes of the AC joints bilaterally.

Aortic Atherosclerosis (LAGGU-D88.8).

## 2022-08-28 ENCOUNTER — Telehealth: Payer: Self-pay | Admitting: Family Medicine

## 2022-08-28 NOTE — Telephone Encounter (Signed)
Spoke to Patient. Sent ENT referral to [facility] per pt's request

## 2022-08-28 NOTE — Telephone Encounter (Signed)
Caller Name: Mirely Pangle Call back phone #: [phone]  Reason for Call: Pt wanted specifically to speak with Referral Coordinator, please give her a call back.

## 2022-09-04 ENCOUNTER — Telehealth: Payer: Self-pay | Admitting: Family Medicine

## 2022-09-04 NOTE — Telephone Encounter (Signed)
PT Contacted. She had concerns about her ENT referral. Pt provider info requested.

## 2022-09-04 NOTE — Telephone Encounter (Signed)
Caller Name: Pt Call back phone #: [phone]  Reason for Call: Pt said you received a message and called her back last week. She is requesting to speak with you again.

## 2022-10-23 ENCOUNTER — Ambulatory Visit (INDEPENDENT_AMBULATORY_CARE_PROVIDER_SITE_OTHER): Payer: Managed Care, Other (non HMO)

## 2022-10-23 DIAGNOSIS — Z23 Encounter for immunization: Secondary | ICD-10-CM | POA: Diagnosis not present

## 2022-10-23 NOTE — Progress Notes (Signed)
After obtaining consent, and per orders of Dr. Gena Fray, injection of Shingles #2 given by Lynda Rainwater. Patient instructed to remain in clinic for 20 minutes afterwards, and to report any adverse reaction to me immediately.

## 2022-10-30 DIAGNOSIS — H9113 Presbycusis, bilateral: Secondary | ICD-10-CM | POA: Insufficient documentation

## 2023-01-26 ENCOUNTER — Telehealth: Payer: Self-pay | Admitting: Family Medicine

## 2023-01-26 DIAGNOSIS — E559 Vitamin D deficiency, unspecified: Secondary | ICD-10-CM

## 2023-01-27 MED ORDER — VITAMIN D (ERGOCALCIFEROL) 1.25 MG (50000 UNIT) PO CAPS: 50000.0000 [IU] | ORAL_CAPSULE | ORAL | 5 refills | Status: DC

## 2023-01-27 NOTE — Addendum Note (Signed)
Addended by: Konrad Saha on: 01/27/2023 01:21 PM   Modules accepted: Orders

## 2023-01-27 NOTE — Telephone Encounter (Signed)
Patient called to follow up on this. She said she was told to contact us about the refill from her pharmacy

## 2023-01-27 NOTE — Telephone Encounter (Signed)
Refill request for  Vit d 50,000 units LR  07/24/22, #5, 5 rfs LOV 07/24/22 FOV  07/28/23

## 2023-01-28 ENCOUNTER — Telehealth: Payer: Self-pay | Admitting: Family Medicine

## 2023-01-28 NOTE — Telephone Encounter (Signed)
Error

## 2023-01-28 NOTE — Telephone Encounter (Signed)
Left VM to rtn call. Dm/cma       

## 2023-01-28 NOTE — Telephone Encounter (Signed)
Pt returned your call about meds.

## 2023-02-26 ENCOUNTER — Encounter: Payer: Self-pay | Admitting: Family Medicine

## 2023-03-17 ENCOUNTER — Telehealth: Payer: Self-pay | Admitting: Family Medicine

## 2023-03-17 NOTE — Telephone Encounter (Signed)
Called patient and scheduled her for an appointment on 03/18/23 @11 :00 am. Dm/cma

## 2023-03-17 NOTE — Telephone Encounter (Signed)
Cold symptoms for 10 days wants to know if she should wait longer or come in congestion not better enough.

## 2023-03-18 ENCOUNTER — Telehealth: Payer: Self-pay | Admitting: Family Medicine

## 2023-03-18 ENCOUNTER — Ambulatory Visit: Payer: Managed Care, Other (non HMO) | Admitting: Family Medicine

## 2023-03-18 NOTE — Telephone Encounter (Signed)
Pt stated she was not told of the 50.00 service fee for late cancellation. She cancelled her appt today.

## 2023-03-19 NOTE — Telephone Encounter (Signed)
1st same day cancel, pt stated she was feeling better and appt not needed.

## 2023-04-13 ENCOUNTER — Encounter: Payer: Self-pay | Admitting: *Deleted

## 2023-04-28 ENCOUNTER — Encounter: Payer: Self-pay | Admitting: Family Medicine

## 2023-04-28 ENCOUNTER — Other Ambulatory Visit (HOSPITAL_BASED_OUTPATIENT_CLINIC_OR_DEPARTMENT_OTHER): Payer: Self-pay | Admitting: Obstetrics and Gynecology

## 2023-04-28 DIAGNOSIS — Z13820 Encounter for screening for osteoporosis: Secondary | ICD-10-CM

## 2023-05-19 ENCOUNTER — Encounter (HOSPITAL_BASED_OUTPATIENT_CLINIC_OR_DEPARTMENT_OTHER): Payer: Self-pay

## 2023-05-19 ENCOUNTER — Other Ambulatory Visit (HOSPITAL_BASED_OUTPATIENT_CLINIC_OR_DEPARTMENT_OTHER): Payer: Managed Care, Other (non HMO)

## 2023-05-20 ENCOUNTER — Telehealth: Payer: Self-pay | Admitting: Family Medicine

## 2023-05-20 NOTE — Telephone Encounter (Signed)
Spoke to patient and advised that she could take Allegra or Zyrtec to help  with the sinus issues due to having Covid.  No further questions. Dm/cma

## 2023-05-20 NOTE — Telephone Encounter (Signed)
Tested pos for covid wants to know what antihistamine you recommend for her congestion.

## 2023-07-28 ENCOUNTER — Encounter: Payer: Self-pay | Admitting: Family Medicine

## 2023-07-28 ENCOUNTER — Ambulatory Visit (INDEPENDENT_AMBULATORY_CARE_PROVIDER_SITE_OTHER): Payer: Managed Care, Other (non HMO) | Admitting: Family Medicine

## 2023-07-28 VITALS — BP 118/84 | HR 74 | Temp 98.4°F | Ht 67.0 in | Wt 219.4 lb

## 2023-07-28 DIAGNOSIS — E559 Vitamin D deficiency, unspecified: Secondary | ICD-10-CM | POA: Diagnosis not present

## 2023-07-28 DIAGNOSIS — E785 Hyperlipidemia, unspecified: Secondary | ICD-10-CM

## 2023-07-28 DIAGNOSIS — Z0000 Encounter for general adult medical examination without abnormal findings: Secondary | ICD-10-CM | POA: Diagnosis not present

## 2023-07-28 LAB — LIPID PANEL
Cholesterol: 235 mg/dL — ABNORMAL HIGH (ref 0–200)
HDL: 55.5 mg/dL (ref >39.00)
LDL Cholesterol: 150 mg/dL — ABNORMAL HIGH (ref 0–99)
NonHDL: 179.16
Total CHOL/HDL Ratio: 4
Triglycerides: 146 mg/dL (ref 0.0–149.0)
VLDL: 29.2 mg/dL (ref 0.0–40.0)

## 2023-07-28 LAB — VITAMIN D 25 HYDROXY (VIT D DEFICIENCY, FRACTURES): VITD: 42.58 ng/mL (ref 30.00–100.00)

## 2023-07-28 MED ORDER — VITAMIN D (ERGOCALCIFEROL) 1.25 MG (50000 UNIT) PO CAPS: 50000.0000 [IU] | ORAL_CAPSULE | ORAL | 3 refills | Status: DC

## 2023-07-28 NOTE — Assessment & Plan Note (Signed)
Plan to reassess lipids today.

## 2023-07-28 NOTE — Assessment & Plan Note (Signed)
We will continue to monitor Emily [name]'s Vitamin D, as she has been prone to low Vit. D levels.

## 2023-07-28 NOTE — Progress Notes (Signed)
[facility] [address] [facility] [address] [place] [address] Dept: [phone] Dept Fax: [phone]  Annual Physical Visit  Subjective:    Patient ID: Emily [name], female    DOB: March 03, 1966, 57 y.o..   MRN: [id number]  Chief Complaint  Patient presents with   Annual Exam    CPE/labs.  Fasting today.  No concerns.    History of Present Illness:  Patient is in today for an annual physical/preventative visit.  Doing well overall. Expecting her first grandchild in Oct.   Review of Systems  Constitutional:  Negative for chills, diaphoresis, fever, malaise/fatigue and weight loss.       Has noted some weight gain. She is engaged in [employer], but notes she is not consistent with this. She does walk in her neighborhood most days (2 miles over 20-25 min).  HENT:  Negative for congestion, ear pain, hearing loss, sinus pain, sore throat and tinnitus.   Eyes:  Negative for blurred vision, pain, discharge and redness.  Respiratory:  Negative for cough, shortness of breath and wheezing.   Cardiovascular:  Negative for chest pain and palpitations.  Gastrointestinal:  Negative for abdominal pain, constipation, diarrhea, heartburn, nausea and vomiting.  Musculoskeletal:  Positive for joint pain. Negative for back pain and myalgias.       Notes some pain at the base of both thumbs, L>R.  Skin:  Negative for itching and rash.  Psychiatric/Behavioral:  Negative for depression. The patient is not nervous/anxious.    Past Medical History: Patient Active Problem List   Diagnosis Date Noted   Borderline hyperlipidemia 06/11/2021   Vitamin D deficiency 06/11/2021   Overactive bladder 06/11/2021   Basal cell carcinoma of lip 06/11/2021   Osteoarthritis of sternoclavicular joint, right 06/06/2021   Aortic atherosclerosis (HCC) 06/06/2021   Adenomatous colon polyp 11/03/2019   Degenerative disc disease, cervical 05/07/2018   Vertigo 04/15/2018    Dysmenorrhea 09/27/2015   Obesity (BMI 30.0-34.9) 09/27/2015   Past Surgical History:  Procedure Laterality Date   bladder botox     BREAST SURGERY  '84   CESAREAN SECTION  '92, '01   x's 2   Family History  Problem Relation Age of Onset   Heart disease Mother    Arthritis Mother    Arthritis Brother    Heart disease Maternal Grandfather    Heart disease Paternal Grandmother    Cancer Paternal Grandfather        Colon   Outpatient Medications Prior to Visit  Medication Sig Dispense Refill   Multiple Vitamin (MULTIVITAMIN WITH MINERALS) TABS tablet Take 1 tablet by mouth daily.     Vitamin D, Ergocalciferol, (DRISDOL) 1.25 MG (50000 UNIT) CAPS capsule Take 1 capsule (50,000 Units total) by mouth every 7 (seven) days. 5 capsule 5   No facility-administered medications prior to visit.   Allergies  Allergen Reactions   Myrbetriq Theodosia Paling Er] Nausea And Vomiting   Other Other (See Comments)    Red meat- abdominal pain   Objective:   Today's Vitals   07/28/23 0834  BP: 118/84  Pulse: 74  Temp: 98.4 F (36.9 C)  TempSrc: Temporal  SpO2: 100%  Weight: 219 lb 6.4 oz (99.5 kg)  Height: 5\' 7"  (1.702 m)   Body mass index is 34.36 kg/m.   General: Well developed, well nourished. No acute distress. HEENT: Normocephalic, non-traumatic. PERRL, EOMI. Conjunctiva clear. External ears normal. EAC and TMs   normal bilaterally. Nose clear without congestion or rhinorrhea. Mucous membranes  moist. Oropharynx clear.   Good dentition. Neck: Supple. No lymphadenopathy. No thyromegaly. Lungs: Clear to auscultation bilaterally. No wheezing, rales or rhonchi. CV: RRR without murmurs or rubs. Pulses 2+ bilaterally. Abdomen: Soft, non-tender. Bowel sounds positive, normal pitch and frequency. No hepatosplenomegaly. No   rebound or guarding. Extremities: Full ROM. No joint swelling or tenderness. Mild tenderness in left 1st MCP joint. Mild crepitance in   knees bilaterally. No edema  noted. Psych: Alert and oriented. Normal mood and affect.  Health Maintenance Due  Topic Date Due   HIV Screening  Never done     Assessment & Plan:   Problem List Items Addressed This Visit       Other   Borderline hyperlipidemia    Plan to reassess lipids today.      Relevant Orders   Lipid panel   Vitamin D deficiency    We will continue to monitor Ms. Worst's Vitamin D, as she has been prone to low Vit. D levels.      Relevant Medications   Vitamin D, Ergocalciferol, (DRISDOL) 1.25 MG (50000 UNIT) CAPS capsule   Other Relevant Orders   VITAMIN D 25 Hydroxy (Vit-D Deficiency, Fractures)   Other Visit Diagnoses     Annual physical exam    -  Primary   Ovrall good health. Encourage eatign a healthy, nutrient-dense diet and continuing regular exercise. UTD on screenings and immunizations.       Return in about 1 year (around 07/27/2024) for Annual preventative care.   Loyola Mast, MD

## 2023-10-16 DIAGNOSIS — M216X9 Other acquired deformities of unspecified foot: Secondary | ICD-10-CM | POA: Insufficient documentation

## 2023-11-12 ENCOUNTER — Other Ambulatory Visit: Payer: Self-pay | Admitting: Orthopaedic Surgery

## 2023-11-12 DIAGNOSIS — M79672 Pain in left foot: Secondary | ICD-10-CM

## 2023-11-16 ENCOUNTER — Ambulatory Visit: Payer: Managed Care, Other (non HMO)

## 2023-11-16 DIAGNOSIS — M79672 Pain in left foot: Secondary | ICD-10-CM

## 2023-11-18 ENCOUNTER — Telehealth: Payer: Self-pay | Admitting: Family Medicine

## 2023-11-18 NOTE — Telephone Encounter (Signed)
Pt returned your call.  

## 2023-11-18 NOTE — Telephone Encounter (Signed)
Pt said her back been hurting since Monday and can't walk without bending over. Currently on celecoxib 200 mg twice a day that was prescribe by the ortho dr. Pt want to know if she should be on muscle relaxer . Pt been using heat, packs

## 2023-11-18 NOTE — Telephone Encounter (Signed)
Left VM to rtn call. Dm/cma       

## 2023-11-18 NOTE — Telephone Encounter (Signed)
Spoke to patient and advised that she would need to come in for an appointment to be seen to get any medication or PT for her back.  She declined an appointment stating that she will wait to see the Orthopedist next week. Dm/cma

## 2023-11-24 ENCOUNTER — Other Ambulatory Visit: Payer: Self-pay | Admitting: Orthopaedic Surgery

## 2023-11-24 DIAGNOSIS — M7742 Metatarsalgia, left foot: Secondary | ICD-10-CM

## 2023-11-30 ENCOUNTER — Ambulatory Visit (HOSPITAL_BASED_OUTPATIENT_CLINIC_OR_DEPARTMENT_OTHER): Admission: RE | Admit: 2023-11-30 | Payer: Managed Care, Other (non HMO) | Source: Ambulatory Visit

## 2023-12-02 ENCOUNTER — Telehealth: Payer: Self-pay | Admitting: Family Medicine

## 2023-12-02 NOTE — Telephone Encounter (Signed)
Patient dropped off document  operations , to be filled out by provider. Patient requested to send it back via Call Patient to pick up within 7-days. Document is located in providers tray at front office.Please advise at Mobile [phone] (mobile)   Pt walked in with form to be filled out. I put in the dr box

## 2023-12-03 NOTE — Telephone Encounter (Signed)
Patient scheduled for a Pre-op appointment on 12/07/23.  Form placed on providers desk. Dm/cma

## 2023-12-08 ENCOUNTER — Ambulatory Visit: Payer: Managed Care, Other (non HMO) | Admitting: Family Medicine

## 2023-12-08 ENCOUNTER — Telehealth: Payer: Self-pay | Admitting: Family Medicine

## 2023-12-08 ENCOUNTER — Encounter: Payer: Self-pay | Admitting: Family Medicine

## 2023-12-08 VITALS — BP 122/70 | HR 76 | Temp 97.7°F | Ht 67.0 in | Wt 229.6 lb

## 2023-12-08 DIAGNOSIS — M1631 Unilateral osteoarthritis resulting from hip dysplasia, right hip: Secondary | ICD-10-CM | POA: Diagnosis not present

## 2023-12-08 DIAGNOSIS — Z01818 Encounter for other preprocedural examination: Secondary | ICD-10-CM | POA: Diagnosis not present

## 2023-12-08 NOTE — Assessment & Plan Note (Signed)
Planned total hip joint replacement. Continue celecoxib 200 mg bid.

## 2023-12-08 NOTE — Progress Notes (Signed)
[facility] [address] [place] [address] Dept: [phone] Dept Fax: [phone]  Preoperative Exam Visit  Subjective:    Patient ID: Emily [name], female    DOB: 1966-03-13, 57 y.o..   MRN: [id number]  Chief Complaint  Patient presents with   Pre-op Exam    Pre-Op clearance for RT hip.   No concerns.    History of Present Illness:  Patient is in today for preoperative assessment at the request of Dr. Linna Caprice (orthopedics) related to a proposed total right hip joint replacement. She has severe degenerative osteoarthritis likely secondary to congenital hip dysplasia. She ahs had rapid increase in pain with persistent hip weakness.  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 12/08/2023   Acquired cavus deformity of foot 10/16/2023   Presbycusis of both ears 10/30/2022   Borderline hyperlipidemia 06/11/2021   Vitamin D deficiency 06/11/2021   Overactive bladder 06/11/2021   Basal cell carcinoma of lip 06/11/2021   Osteoarthritis of sternoclavicular joint, right 06/06/2021   Aortic atherosclerosis (HCC) 06/06/2021   Adenomatous colon polyp 11/03/2019   Vertigo 04/15/2018   Obesity (BMI 30.0-34.9) 09/27/2015   Past Surgical History:  Procedure Laterality Date   bladder botox     BREAST SURGERY  '84   CESAREAN SECTION  '92, '01   x's 2   Family History  Problem Relation Age of Onset   Heart disease Mother    Arthritis Mother    Arthritis Brother    Heart disease Maternal Grandfather    Heart disease Paternal Grandmother    Cancer Paternal Grandfather        Colon   Outpatient Medications Prior to Visit  Medication Sig Dispense Refill   BOTOX 100 units SOLR injection      celecoxib (CELEBREX) 200 MG capsule Take 200 mg by mouth 2 (two) times daily.     Multiple Vitamin (MULTIVITAMIN WITH MINERALS) TABS tablet Take 1  tablet by mouth daily.     Vitamin D, Ergocalciferol, (DRISDOL) 1.25 MG (50000 UNIT) CAPS capsule Take 1 capsule (50,000 Units total) by mouth every 7 (seven) days. 12 capsule 3   No facility-administered medications prior to visit.   Allergies  Allergen Reactions   Myrbetriq Theodosia Paling Er] Nausea And Vomiting   Other Other (See Comments)    Red meat- abdominal pain     Objective:   Today's Vitals   12/08/23 1613  BP: 122/70  Pulse: 76  Temp: 97.7 F (36.5 C)  TempSrc: Temporal  SpO2: 100%  Weight: 229 lb 9.6 oz (104.1 kg)  Height: 5\' 7"  (1.702 m)   Body mass index is 35.96 kg/m.   General: Well developed, well nourished. No acute distress. HEENT: Normocephalic, non-traumatic. PERRL, EOMI. Conjunctiva clear. External ears normal. EAC and TMs normal   bilaterally. Nose clear without congestion or rhinorrhea. Mucous membranes moist. Oropharynx clear. Good dentition. Neck: Supple. No lymphadenopathy. No thyromegaly. Lungs: Clear to auscultation bilaterally. No wheezing, rales or rhonchi. CV: RRR without murmurs or rubs. Pulses 2+ bilaterally. Abdomen: Soft, non-tender. Bowel sounds positive, normal pitch and frequency. No hepatosplenomegaly. No rebound or   guarding. Extremities: Full ROM of most joints. There is mild crepitance of the right knee. No edema noted. Skin: Warm and dry. No rashes. Psych: Alert and oriented. Normal mood and affect.  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 Continue celecoxib 200 mg bid.      Relevant Medications   celecoxib (CELEBREX) 200 MG capsule   Other Visit Diagnoses     Preoperative examination    -  Primary   Overall excellent health and low risk for surgery. I will check a CBC and BMP as requested by  surgeon. If normal, I will plan to clear Ms. Lilley for surgery.   Relevant Orders   CBC   Basic metabolic panel       Return if symptoms worsen or fail to improve.   Loyola Mast, MD

## 2023-12-08 NOTE — Telephone Encounter (Signed)
12/08/2023 appt time 2:20, pt arrived 2:40p, per Dr. Veto Kemps scheduled today for 4:00p and asked pt to arrive back in office 3:45p  03/18/23 same day cancel/feels better  Do you want me to count as 2 missed visits and send final warning letter?

## 2023-12-09 ENCOUNTER — Other Ambulatory Visit: Payer: Self-pay

## 2023-12-09 LAB — CBC
HCT: 41.6 % (ref 36.0–46.0)
Hemoglobin: 13.7 g/dL (ref 12.0–15.0)
MCHC: 32.8 g/dL (ref 30.0–36.0)
MCV: 87.8 fL (ref 78.0–100.0)
Platelets: 269 10*3/uL (ref 150.0–400.0)
RBC: 4.74 Mil/uL (ref 3.87–5.11)
RDW: 14 % (ref 11.5–15.5)
WBC: 5.7 10*3/uL (ref 4.0–10.5)

## 2023-12-09 LAB — BASIC METABOLIC PANEL
BUN: 21 mg/dL (ref 6–23)
CO2: 30 meq/L (ref 19–32)
Calcium: 9.5 mg/dL (ref 8.4–10.5)
Chloride: 101 meq/L (ref 96–112)
Creatinine, Ser: 0.79 mg/dL (ref 0.40–1.20)
GFR: 81.67 mL/min (ref >60.00)
Glucose, Bld: 79 mg/dL (ref 70–99)
Potassium: 5.1 meq/L (ref 3.5–5.1)
Sodium: 139 meq/L (ref 135–145)

## 2023-12-09 NOTE — Telephone Encounter (Signed)
Form filled out and faxedd along with last OV note and labs to [phone] [facility].   Patient notified VIA. Dm/cma

## 2023-12-10 ENCOUNTER — Ambulatory Visit: Payer: Managed Care, Other (non HMO) | Admitting: Dermatology

## 2023-12-10 ENCOUNTER — Encounter: Payer: Self-pay | Admitting: Dermatology

## 2023-12-10 DIAGNOSIS — R238 Other skin changes: Secondary | ICD-10-CM | POA: Diagnosis not present

## 2023-12-10 DIAGNOSIS — L853 Xerosis cutis: Secondary | ICD-10-CM

## 2023-12-10 DIAGNOSIS — B078 Other viral warts: Secondary | ICD-10-CM | POA: Diagnosis not present

## 2023-12-10 MED ORDER — SAFETY SEAL MISCELLANEOUS MISC: 0 refills | Status: DC

## 2023-12-10 NOTE — Patient Instructions (Addendum)
Hello Tangy,  Thank you for visiting my office today. Your dedication to addressing your dermatological concerns and improving your health is greatly appreciated. Below is a summary of the key instructions and recommendations from today's consultation:  - Wart Treatment on Finger:   - Treatment Applied: Liquid nitrogen and Cantharone were used on the wart on your finger.   - Post-Treatment Care: After 4 hours, remove the Band-Aid, wash the area with soap and water, and then apply a new plain Band-Aid.   - Expected Outcome: A blister should form within 2 days. Gently remove dead skin if it is not painful.   - Follow-Up Treatments: Repeat treatment every 4 to 6 weeks, with possibly two treatments needed.   - Prescription: A prescription for Wart Pen from [facility] pharmacy will be sent electronically. Start using it 5-7 days after today's treatment once irritation subsides.  - Dry Hands:   - Recommendation: Use hand creams regularly, especially at night.   - Protective Measures: Wear gloves when washing dishes or cleaning to protect your skin.  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   - Treatment Options: Sebaceous hyperplasia spots can be cosmetically treated starting at $200 for up to 15 spots.  - Previous Cyst Treatment:   - Procedure: The cyst required Mohs surgery and involved a plastic surgeon due to its precancerous nature.  - Upcoming Appointments:   - Next Steps: We will conduct a full skin check during your next visit in 6 weeks to ensure there are no suspicious areas and to update on your wart treatment.  Please do not hesitate to reach out if you have any questions or need further clarification on your treatment plan.  Warm regards,  Dr. [facility]     Cryotherapy Aftercare  Wash gently with soap and water everyday.   Apply Vaseline and Band-Aid daily until healed.    Important  Information  Due to recent changes in healthcare laws, you may see results of your pathology and/or laboratory studies on MyChart before the doctors have had a chance to review them. We understand that in some cases there may be results that are confusing or concerning to you. Please understand that not all results are received at the same time and often the doctors may need to interpret multiple results in order to provide you with the best plan of care or course of treatment. Therefore, we ask that you please give [place] 2 business days to thoroughly review all your results before contacting the office for clarification. Should we see a critical lab result, you will be contacted sooner.   If You Need Anything After Your Visit  If you have any questions or concerns for your doctor, please call our main line at [phone] If no one answers, please leave a voicemail as directed and we will return your call as soon as possible. Messages left after 4 pm will be answered the following business day.   You may also send [place] a message via MyChart. We typically respond to MyChart messages within 1-2 business days.  For prescription refills, please ask your pharmacy to contact our office. Our fax number is [phone].  If you have an urgent issue when the clinic is closed that cannot wait until the next business day, you can page your doctor at the number below.    Please note that while we do our best to  be available for urgent issues outside of office hours, we are not available 24/7.   If you have an urgent issue and are unable to reach [place], you may choose to seek medical care at your doctor's office, retail clinic, urgent care center, or emergency room.  If you have a medical emergency, please immediately call 911 or go to the emergency department. In the event of inclement weather, please call our main line at [phone] for an update on the status of any delays or closures.  Dermatology Medication  Tips: Please keep the boxes that topical medications come in in order to help keep track of the instructions about where and how to use these. Pharmacies typically print the medication instructions only on the boxes and not directly on the medication tubes.   If your medication is too expensive, please contact our office at [phone] or send [place] a message through MyChart.   We are unable to tell what your co-pay for medications will be in advance as this is different depending on your insurance coverage. However, we may be able to find a substitute medication at lower cost or fill out paperwork to get insurance to cover a needed medication.   If a prior authorization is required to get your medication covered by your insurance company, please allow [place] 1-2 business days to complete this process.  Drug prices often vary depending on where the prescription is filled and some pharmacies may offer cheaper prices.  The website www.goodrx.com contains coupons for medications through different pharmacies. The prices here do not account for what the cost may be with help from insurance (it may be cheaper with your insurance), but the website can give you the price if you did not use any insurance.  - You can print the associated coupon and take it with your prescription to the pharmacy.  - You may also stop by our office during regular business hours and pick up a GoodRx coupon card.  - If you need your prescription sent electronically to a different pharmacy, notify our office through [facility] or by phone at [phone]

## 2023-12-10 NOTE — Progress Notes (Signed)
New Patient Visit   Subjective  Emily [name] is a 57 y.o. female who presents for the following: New Pt - Wart on left thumb  Patient states she has wart located at the finger that she would like to have examined. Patient reports the areas have been there for +1 year. She reports the areas are bothersome.Patient rates irritation 1 out of 10. She states that the areas have not spread. Patient reports she has not previously been treated for these areas. Patient reports Hx of bx resulted as precancerous. Patient denies family history of skin cancer(s).  The patient has spots, moles and lesions to be evaluated, some may be new or changing and the patient may have concern these could be cancer.   The following portions of the chart were reviewed this encounter and updated as appropriate: medications, allergies, medical history  Review of Systems:  No other skin or systemic complaints except as noted in HPI or Assessment and Plan.  Objective  Well appearing patient in no apparent distress; mood and affect are within normal limits.   A focused examination was performed of the following areas: left thumb   Relevant exam findings are noted in the Assessment and Plan.    Left Thumb Tip Verrucous papules   Assessment & Plan   1. Wart on Finger - Assessment: Patient presents with a painful wart on the finger, unlikely to resolve spontaneously. - Plan:   - Apply liquid nitrogen and Cantharone to the wart.   - Instruct patient to remove Band-Aid after 4 hours, wash with soap and water, and apply a plain Band-Aid.   - Expect blister formation within 2 days.   - Repeat treatment every 4-6 weeks, potentially requiring two treatments.   - Prescribe Wart Pen (salicylic acid and antivirals) from [facility] pharmacy to start using 5-7 days after initial treatment.   - Follow-up in 6 weeks for wart treatment update and full-body skin check.   - Recommend use of hand creams and gloves for  dishwashing and cleaning to prevent recurrence.  2. Dry Hands - Assessment: Patient has very dry hands, increasing susceptibility to wart formation. - Plan:   - Recommend regular use of hand creams, especially at night.   - Advise wearing gloves when washing dishes or cleaning to protect the skin barrier.  3. Facial Lesions - Assessment: Patient has multiple facial lesions, including a likely compound nevus and sebaceous hyperplasia. - Plan:   - Pt will return for FBSE   - Offer cosmetic treatment for sebaceous hyperplasia using hyperkatoid ($200 for up to 15 spots) if patient desires.  OTHER VIRAL WARTS Left Thumb Tip Safety Seal Miscellaneous MISC - Left Thumb Tip Wart Pen 60 clicks made with Acyclovir USP 5% Cimetidine USP 2% Imiquimod USP 2.5% Tretinoin USP 0.05%  Destruction of lesion - Left Thumb Tip Complexity: simple   Destruction method: cryotherapy   Informed consent: discussed and consent obtained   Timeout:  patient name, date of birth, surgical site, and procedure verified Lesion destroyed using liquid nitrogen: Yes   Region frozen until ice ball extended beyond lesion: Yes   Outcome: patient tolerated procedure well with no complications   Post-procedure details: wound care instructions given    Destruction of lesion - Left Thumb Tip Complexity: simple   Destruction method: chemical removal   Timeout:  patient name, date of birth, surgical site, and procedure verified Chemical destruction method: cantharidin   Procedure instructions: patient instructed to wash and dry area  Outcome: patient tolerated procedure well with no complications   Post-procedure details: sterile dressing applied   Dressing type: bandage    Return in about 6 weeks (around 01/21/2024) for TBSE & wart f/u.    Documentation: I have reviewed the above documentation for accuracy and completeness, and I agree with the above.  I, Shirron Marcha Solders, CMA, am acting as scribe for [employer],  DO.   Langston Reusing, DO

## 2023-12-17 ENCOUNTER — Other Ambulatory Visit (HOSPITAL_BASED_OUTPATIENT_CLINIC_OR_DEPARTMENT_OTHER): Payer: Self-pay | Admitting: Orthopaedic Surgery

## 2023-12-17 ENCOUNTER — Other Ambulatory Visit (HOSPITAL_BASED_OUTPATIENT_CLINIC_OR_DEPARTMENT_OTHER): Payer: Managed Care, Other (non HMO)

## 2023-12-17 DIAGNOSIS — M79669 Pain in unspecified lower leg: Secondary | ICD-10-CM

## 2023-12-17 DIAGNOSIS — M79606 Pain in leg, unspecified: Secondary | ICD-10-CM | POA: Insufficient documentation

## 2024-01-21 ENCOUNTER — Ambulatory Visit: Payer: Managed Care, Other (non HMO) | Admitting: Dermatology

## 2024-01-21 ENCOUNTER — Encounter: Payer: Self-pay | Admitting: Dermatology

## 2024-01-21 VITALS — BP 111/69 | HR 96

## 2024-01-21 DIAGNOSIS — B079 Viral wart, unspecified: Secondary | ICD-10-CM | POA: Diagnosis not present

## 2024-01-21 NOTE — Progress Notes (Signed)
   Follow-Up Visit   Subjective  Emily [name] is a 58 y.o. female who presents for the following: Wart  Patient present today for follow up visit for Wart. Patient was last evaluated on 12/10/23. Patient reports sxs are better. Patient denies medication changes.  The following portions of the chart were reviewed this encounter and updated as appropriate: medications, allergies, medical history  Review of Systems:  No other skin or systemic complaints except as noted in HPI or Assessment and Plan.  Objective  Well appearing patient in no apparent distress; mood and affect are within normal limits.  A focused examination was performed of the following areas: Left Thumb  Relevant exam findings are noted in the Assessment and Plan.  Left Thumb Tip Verrucous papules   Assessment & Plan   WART Exam: verrucous papule(s)  Counseling Discussed viral / HPV (Human Papilloma Virus) etiology and risk of spread /infectivity to other areas of body as well as to other people.  Multiple treatments and methods may be required to clear warts and it is possible treatment may not be successful.  Treatment risks include discoloration; scarring and there is still potential for wart recurrence. VIRAL WARTS, UNSPECIFIED TYPE Left Thumb Tip Destruction of lesion - Left Thumb Tip  Destruction method: cryotherapy   Informed consent: discussed and consent obtained   Timeout:  patient name, date of birth, surgical site, and procedure verified Lesion destroyed using liquid nitrogen: Yes   Cryotherapy cycles:  1 Outcome: patient tolerated procedure well with no complications    Return in about 6 months (around 07/20/2024) for TBSE.  Documentation: I have reviewed the above documentation for accuracy and completeness, and I agree with the above.  Stasia Cavalier, am acting as scribe for Langston Reusing, DO.  Langston Reusing, DO

## 2024-01-21 NOTE — Patient Instructions (Addendum)
Cryotherapy Aftercare  Wash gently with soap and water everyday.   Apply Vaseline and Band-Aid daily until healed.   Important Information   Due to recent changes in healthcare laws, you may see results of your pathology and/or laboratory studies on MyChart before the doctors have had a chance to review them. We understand that in some cases there may be results that are confusing or concerning to you. Please understand that not all results are received at the same time and often the doctors may need to interpret multiple results in order to provide you with the best plan of care or course of treatment. Therefore, we ask that you please give Emily [name] 2 business days to thoroughly review all your results before contacting the office for clarification. Should we see a critical lab result, you will be contacted sooner.     If You Need Anything After Your Visit   If you have any questions or concerns for your doctor, please call our main line at [phone]. If no one answers, please leave a voicemail as directed and we will return your call as soon as possible. Messages left after 4 pm will be answered the following business day.    You may also send Emily [name] a message via MyChart. We typically respond to MyChart messages within 1-2 business days.  For prescription refills, please ask your pharmacy to contact our office. Our fax number is [phone].  If you have an urgent issue when the clinic is closed that cannot wait until the next business day, you can page your doctor at the number below.     Please note that while we do our best to be available for urgent issues outside of office hours, we are not available 24/7.    If you have an urgent issue and are unable to reach Emily [name], you may choose to seek medical care at your doctor's office, retail clinic, urgent care center, or emergency room.   If you have a medical emergency, please immediately call 911 or go to the emergency department. In the event  of inclement weather, please call our main line at [phone] for an update on the status of any delays or closures.  Dermatology Medication Tips: Please keep the boxes that topical medications come in in order to help keep track of the instructions about where and how to use these. Pharmacies typically print the medication instructions only on the boxes and not directly on the medication tubes.   If your medication is too expensive, please contact our office at [phone] or send Emily [name] a message through MyChart.    We are unable to tell what your co-pay for medications will be in advance as this is different depending on your insurance coverage. However, we may be able to find a substitute medication at lower cost or fill out paperwork to get insurance to cover a needed medication.    If a prior authorization is required to get your medication covered by your insurance company, please allow Emily [name] 1-2 business days to complete this process.   Drug prices often vary depending on where the prescription is filled and some pharmacies may offer cheaper prices.   The website www.goodrx.com contains coupons for medications through different pharmacies. The prices here do not account for what the cost may be with help from insurance (it may be cheaper with your insurance), but the website can give you the price if you did not use any insurance.  - You can print the  associated coupon and take it with your prescription to the pharmacy.  - You may also stop by our office during regular business hours and pick up a GoodRx coupon card.  - If you need your prescription sent electronically to a different pharmacy, notify our office through [facility] or by phone at [phone]

## 2024-02-11 ENCOUNTER — Encounter: Payer: Self-pay | Admitting: Internal Medicine

## 2024-02-11 ENCOUNTER — Ambulatory Visit: Payer: Managed Care, Other (non HMO) | Admitting: Internal Medicine

## 2024-02-11 VITALS — BP 124/72 | HR 89 | Temp 98.3°F | Ht 67.0 in | Wt 233.8 lb

## 2024-02-11 DIAGNOSIS — J0100 Acute maxillary sinusitis, unspecified: Secondary | ICD-10-CM

## 2024-02-11 MED ORDER — AMOXICILLIN-POT CLAVULANATE 875-125 MG PO TABS: 1.0000 | ORAL_TABLET | Freq: Two times a day (BID) | ORAL | 0 refills | Status: AC

## 2024-02-11 MED ORDER — FLUCONAZOLE 150 MG PO TABS: ORAL_TABLET | ORAL | 0 refills | Status: DC

## 2024-02-11 NOTE — Patient Instructions (Signed)
Can do :  Flonase (fluticasone) 1 spray each nostril 1-2 times a day  Tylenol if needed  Nasal saline spray as needed Sinus rinse as needed     SINUS RINSE INSTRUCTIONS Step 1 Please wash your hands. Fill the clean bottle with the designated volume of warmed distilled, microfiltered (through 0.2 micron filter), reverse osmosis filtered, or commercially bottled water.  Step 2 Cut the Sinus RinseT mixture packet at the corner and pour its contents into the bottle. Tighten the cap and tube on the bottle securely. Place one finger over the tip of the cap and shake the bottle gently to dissolve the mixture. Always rinse your nasal passages with [employer]Med SINUS RINSE packets only.  Step 3 Standing in front of a sink, bend forward to your comfort level and tilt your head down. Keeping your mouth open, without holding your breath, place the cap snugly against your nasal passage. SQUEEZE BOTTLE GENTLY until the solution starts draining from the OPPOSITE nasal passage. Some may drain from your mouth. For a proper rinse, keep squeezing the bottle GENTLY until at least 1/4 to 1/2 (60 mL to 120 mL or 2 to 4 fl oz) of the bottle is used. Do not swallow the solution.  Step 4 Blow your nose very gently, without pinching nose completely to avoid pressure on eardrums. If tolerable, sniff in gently any residual solution remaining in the nasal passage once or twice, because this may clean out the posterior nasopharyngeal area, which is the area at the back of your nasal passage. At times, some solution will reach the back of your throat, so please spit it out. To help drain any residual solution, blow your nose gently while tilting your head forward and to the opposite side of the nasal passage you just rinsed.  Step 5 Now repeat steps 3 and 4 for your other nasal passage. Most users find that rinsing twice a day is beneficial, similar to brushing your teeth. Many doctors recommend rinsing 3-4 times daily or for  special circumstances, even rinsing up to 6 times a day is safe. Please follow your physician's advice.  Step 6 Clean the bottle and cap. Air dry the Sinus RinseT bottle, cap, and tube on a clean paper towel, a lint free towel, or use [employer]Med NasaDOCK or NasaDOCK plusT (sold separately) to store the bottle, cap and tube

## 2024-02-11 NOTE — Progress Notes (Unsigned)
[facility] [address] [place] [address] Dept: [phone] Dept Fax: [phone]  Acute Care Office Visit  Subjective:   Emily [name] [birth year] 02/11/2024  Chief Complaint  Patient presents with   Sinusitis    Sinus pressure started on Sunday had cold symptoms 20 days ago    HPI: Discussed the use of AI scribe software for clinical note transcription with the patient, who gave verbal consent to proceed.  History of Present Illness   The patient presents with sinus pressure that has persisted for approximately 23 days. The symptoms began with a cold, characterized by copious mucus production and coughing, which she contracted from her son. The cold symptoms lasted for 20 days, after which the patient noticed a shift in her symptoms from a productive cough and runny nose to a feeling of pressure in her sinuses. The patient denies any associated fever, body aches, sore throat, earache, ear fullness, or headaches. She also tested negative for COVID-19 at the onset of her symptoms. The patient is concerned about the possibility of a sinus infection, especially as she is due to fly in the coming week.  In addition to the sinus pressure, the patient also discusses her recent bladder Botox treatment and the upcoming hip replacement surgery. She mentions that she had to stop taking Celebrex, an anti-inflammatory medication, for the bladder treatment, which led to increased pain and difficulty walking. 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       The following portions of the patient's history were reviewed and updated as appropriate: past medical history, past surgical history, family history, social history, allergies, medications, and problem list.   Patient Active Problem List   Diagnosis Date Noted   Pain in lower  limb 12/17/2023   Osteoarthritis resulting from right hip dysplasia 12/08/2023   Acquired cavus deformity of foot 10/16/2023   Presbycusis of both ears 10/30/2022   Borderline hyperlipidemia 06/11/2021   Vitamin D deficiency 06/11/2021   Overactive bladder 06/11/2021   Basal cell carcinoma of lip 06/11/2021   Osteoarthritis of sternoclavicular joint, right 06/06/2021   Aortic atherosclerosis (HCC) 06/06/2021   Adenomatous colon polyp 11/03/2019   Vertigo 04/15/2018   Obesity (BMI 30.0-34.9) 09/27/2015   Past Medical History:  Diagnosis Date   Chicken pox    Colon polyp    Genital warts    GERD (gastroesophageal reflux disease)    Meningitis spinal 2012   Overactive bladder    Vertigo    Past Surgical History:  Procedure Laterality Date   bladder botox     BREAST SURGERY  '84   CESAREAN SECTION  '92, '01   x's 2   Family History  Problem Relation Age of Onset   Heart disease Mother    Arthritis Mother    Arthritis Brother    Heart disease Maternal Grandfather    Heart disease Paternal Grandmother    Cancer Paternal Grandfather        Colon    Current Outpatient Medications:    amoxicillin-clavulanate (AUGMENTIN) 875-125 MG tablet, Take 1 tablet by mouth 2 (two) times daily for 7 days., Disp: 14 tablet, Rfl: 0   BOTOX 100 units SOLR injection, , Disp: , Rfl:    celecoxib (CELEBREX) 200 MG capsule, Take 200 mg by mouth 2 (two) times daily., Disp: , Rfl:    fluconazole (DIFLUCAN) 150 MG  tablet, Take 1 tablet by mouth once. Repeat dose in 3 days if symptoms persist., Disp: 2 tablet, Rfl: 0   Safety Seal Miscellaneous MISC, Wart Pen 60 clicks made with Acyclovir USP 5% Cimetidine USP 2% Imiquimod USP 2.5% Tretinoin USP 0.05%, Disp: 60 Application, Rfl: 0   Vitamin D, Ergocalciferol, (DRISDOL) 1.25 MG (50000 UNIT) CAPS capsule, Take 1 capsule (50,000 Units total) by mouth every 7 (seven) days., Disp: 12 capsule, Rfl: 3   diclofenac (VOLTAREN) 75 MG EC tablet, Take 1 tablet  twice a day by oral route with meal(s) for 30 days. (Patient not taking: Reported on 02/11/2024), Disp: , Rfl:    Multiple Vitamin (MULTIVITAMIN WITH MINERALS) TABS tablet, Take 1 tablet by mouth daily. (Patient not taking: Reported on 02/11/2024), Disp: , Rfl:  Allergies  Allergen Reactions   Myrbetriq Theodosia Paling Er] Nausea And Vomiting   Other Other (See Comments)    Red meat- abdominal pain     ROS: A complete ROS was performed with pertinent positives/negatives noted in the HPI. The remainder of the ROS are negative.    Objective:   Today's Vitals   02/11/24 1322  BP: 124/72  Pulse: 89  Temp: 98.3 F (36.8 C)  TempSrc: Temporal  SpO2: 97%  Weight: 233 lb 12.8 oz (106.1 kg)  Height: 5\' 7"  (1.702 m)    GENERAL: Well-appearing, in NAD. Well nourished.  SKIN: Pink, warm and dry. No rash.  HEENT:    HEAD: Normocephalic, non-traumatic.  EYES: Conjunctive pink without exudate.  EARS: External ear w/o redness, swelling, masses, or lesions. EAC clear. TM's intact, translucent w/o bulging, appropriate landmarks visualized.  NOSE: Septum midline w/o deformity. Nares patent, mucosa pink and inflamed turbinates w/o drainage. Maxillary sinus tenderness THROAT: Uvula midline. Oropharynx clear. Tonsils non-inflamed w/o exudate . Mucus membranes pink and moist.  NECK: Trachea midline. Full ROM w/o pain or tenderness. No lymphadenopathy.  RESPIRATORY: Chest wall symmetrical. Respirations even and non-labored. Breath sounds clear to auscultation bilaterally.  CARDIAC: S1, S2 present, regular rate and rhythm. Peripheral pulses 2+ bilaterally.  EXTREMITIES: Without clubbing, cyanosis, or edema.  NEUROLOGIC: Steady, even gait.  PSYCH/MENTAL STATUS: Alert, oriented x 3. Cooperative, appropriate mood and affect.    No results found for any visits on 02/11/24.    Assessment & Plan:  Assessment and Plan    Sinusitis Persistent sinus pressure following a prolonged cold. No fever, sore throat,  or earache. No signs of systemic infection. -Prescribe Augmentin 1 tablet BID for 7 days. -Advise to use Flonase (Fluticasone) 1 spray each nostril 1-2 times a day, nasal saline spray as needed, and Tylenol for pain as needed. -Provide Diflucan (Fluconazole) for potential yeast infection following antibiotic use. Acute non-recurrent maxillary sinusitis -     Amoxicillin-Pot Clavulanate; Take 1 tablet by mouth 2 (two) times daily for 7 days.  Dispense: 14 tablet; Refill: 0  Other orders -     Fluconazole; Take 1 tablet by mouth once. Repeat dose in 3 days if symptoms persist.  Dispense: 2 tablet; Refill: 0   Meds ordered this encounter  Medications   amoxicillin-clavulanate (AUGMENTIN) 875-125 MG tablet    Sig: Take 1 tablet by mouth 2 (two) times daily for 7 days.    Dispense:  14 tablet    Refill:  0    Supervising Provider:   Garnette Gunner [[id number]]   fluconazole (DIFLUCAN) 150 MG tablet    Sig: Take 1 tablet by mouth once. Repeat dose in 3 days if symptoms  persist.    Dispense:  2 tablet    Refill:  0    Supervising Provider:   Garnette Gunner [[id number]]   No orders of the defined types were placed in this encounter.  Lab Orders  No laboratory test(s) ordered today   No images are attached to the encounter or orders placed in the encounter.  Return if symptoms worsen or fail to improve.   Salvatore Decent, FNP

## 2024-05-04 ENCOUNTER — Ambulatory Visit: Admitting: Internal Medicine

## 2024-05-04 ENCOUNTER — Encounter: Payer: Self-pay | Admitting: Internal Medicine

## 2024-05-04 VITALS — BP 120/80 | HR 94 | Temp 97.8°F | Ht 67.0 in | Wt 240.8 lb

## 2024-05-04 DIAGNOSIS — R21 Rash and other nonspecific skin eruption: Secondary | ICD-10-CM

## 2024-05-04 DIAGNOSIS — E559 Vitamin D deficiency, unspecified: Secondary | ICD-10-CM

## 2024-05-04 MED ORDER — CLOTRIMAZOLE-BETAMETHASONE 1-0.05 % EX CREA: 1.0000 | TOPICAL_CREAM | Freq: Two times a day (BID) | CUTANEOUS | 0 refills | Status: DC

## 2024-05-04 NOTE — Progress Notes (Unsigned)
 [facility] [address] [place] [address] Dept: [phone] Dept Fax: [phone]  Acute Care Office Visit  Subjective:   Emily [name] [birth year] 05/04/2024  Chief Complaint  Patient presents with   Rash    Started mid jan    HPI:  RASH: Onset: January  Location: chest, neck     Treatments tried: lotions. Vani cream,  Uses dove sensative skin , hypochlorous cleansing solution          New medications/antibiotics: no  Tick/insect/pet exposure: no  Recent travel: no  New detergent, new clothing, or other topical exposure: no   Pruritis:no  , just flaky  Feeling ill: no  Fever: no    VITAMIN D  DEFICIENCY:  History of Vitamin D  deficiency. Is due for refill of supplement.  Last vitamin D  Lab Results  Component Value Date   VD25OH 42.58 07/28/2023     The following portions of the patient's history were reviewed and updated as appropriate: past medical history, past surgical history, family history, social history, allergies, medications, and problem list.   Patient Active Problem List   Diagnosis Date Noted   Pain in lower limb 12/17/2023   Osteoarthritis resulting from right hip dysplasia 12/08/2023   Acquired cavus deformity of foot 10/16/2023   Presbycusis of both ears 10/30/2022   Borderline hyperlipidemia 06/11/2021   Vitamin D  deficiency 06/11/2021   Overactive bladder 06/11/2021   Basal cell carcinoma of lip 06/11/2021   Osteoarthritis of sternoclavicular joint, right 06/06/2021   Aortic atherosclerosis (HCC) 06/06/2021   Adenomatous colon polyp 11/03/2019   Vertigo 04/15/2018   Obesity (BMI 30.0-34.9) 09/27/2015   Past Medical History:  Diagnosis Date   Chicken pox    Colon polyp    Genital warts    GERD (gastroesophageal reflux disease)    Meningitis spinal 2012   Overactive bladder    Vertigo    Past Surgical History:  Procedure Laterality Date   bladder botox     BREAST  SURGERY  '84   CESAREAN SECTION  '92, '01   x's 2   Family History  Problem Relation Age of Onset   Heart disease Mother    Arthritis Mother    Arthritis Brother    Heart disease Maternal Grandfather    Heart disease Paternal Grandmother    Cancer Paternal Grandfather        Colon    Current Outpatient Medications:    clotrimazole-betamethasone (LOTRISONE) cream, Apply 1 Application topically 2 (two) times daily., Disp: 60 g, Rfl: 0   Vitamin D , Ergocalciferol , (DRISDOL ) 1.25 MG (50000 UNIT) CAPS capsule, Take 1 capsule (50,000 Units total) by mouth every 7 (seven) days., Disp: 12 capsule, Rfl: 3   BOTOX 100 units SOLR injection, , Disp: , Rfl:    celecoxib  (CELEBREX ) 200 MG capsule, Take 200 mg by mouth 2 (two) times daily. (Patient not taking: Reported on 05/04/2024), Disp: , Rfl:    diclofenac (VOLTAREN) 75 MG EC tablet, Take 1 tablet twice a day by oral route with meal(s) for 30 days. (Patient not taking: Reported on 05/04/2024), Disp: , Rfl:    fluconazole  (DIFLUCAN ) 150 MG tablet, Take 1 tablet by mouth once. Repeat dose in 3 days if symptoms persist. (Patient not taking: Reported on 05/04/2024), Disp: 2 tablet, Rfl: 0   Multiple Vitamin (MULTIVITAMIN WITH MINERALS) TABS tablet, Take 1 tablet by mouth daily. (Patient not taking: Reported on 05/04/2024), Disp: , Rfl:    Safety Seal Miscellaneous MISC,  Wart Pen 60 clicks made with Acyclovir USP 5% Cimetidine USP 2% Imiquimod USP 2.5% Tretinoin USP 0.05% (Patient not taking: Reported on 05/04/2024), Disp: 60 Application, Rfl: 0 Allergies  Allergen Reactions   Myrbetriq Aliene Ip Er] Nausea And Vomiting   Other Other (See Comments)    Red meat- abdominal pain     ROS: A complete ROS was performed with pertinent positives/negatives noted in the HPI. The remainder of the ROS are negative.    Objective:   Today's Vitals   05/04/24 1437  BP: 120/80  Pulse: 94  Temp: 97.8 F (36.6 C)  TempSrc: Temporal  SpO2: 97%  Weight: 240 lb 12.8  oz (109.2 kg)  Height: 5\' 7"  (1.702 m)    GENERAL: Well-appearing, in NAD. Well nourished.  SKIN: Pink, warm and dry. Erythematous maculopapular rash to mid chest and anterior neck RESPIRATORY: Chest wall symmetrical. Respirations even and non-labored.  CARDIAC: Peripheral pulses 2+ bilaterally.  NEUROLOGIC: Steady, even gait.  PSYCH/MENTAL STATUS: Alert, oriented x 3. Cooperative, appropriate mood and affect.    No results found for any visits on 05/04/24.    Assessment & Plan:  1. Rash (Primary) - clotrimazole-betamethasone (LOTRISONE) cream; Apply 1 Application topically 2 (two) times daily.  Dispense: 60 g; Refill: 0  2. Vitamin D  deficiency - VITAMIN D  25 Hydroxy (Vit-D Deficiency, Fractures) - will check level prior to sending in refill .   Meds ordered this encounter  Medications   clotrimazole-betamethasone (LOTRISONE) cream    Sig: Apply 1 Application topically 2 (two) times daily.    Dispense:  60 g    Refill:  0    Supervising Provider:   Catheryn Cluck [[id number]]   Orders Placed This Encounter  Procedures   VITAMIN D  25 Hydroxy (Vit-D Deficiency, Fractures)   Lab Orders         VITAMIN D  25 Hydroxy (Vit-D Deficiency, Fractures)     No images are attached to the encounter or orders placed in the encounter.  Return if symptoms worsen or fail to improve.   Gavin Kast, FNP

## 2024-05-05 ENCOUNTER — Other Ambulatory Visit: Payer: Self-pay | Admitting: Internal Medicine

## 2024-05-05 ENCOUNTER — Encounter: Payer: Self-pay | Admitting: Family Medicine

## 2024-05-05 ENCOUNTER — Encounter: Payer: Self-pay | Admitting: Internal Medicine

## 2024-05-05 DIAGNOSIS — E559 Vitamin D deficiency, unspecified: Secondary | ICD-10-CM

## 2024-05-05 LAB — VITAMIN D 25 HYDROXY (VIT D DEFICIENCY, FRACTURES): VITD: 33.88 ng/mL (ref 30.00–100.00)

## 2024-05-05 MED ORDER — VITAMIN D (ERGOCALCIFEROL) 1.25 MG (50000 UNIT) PO CAPS: 50000.0000 [IU] | ORAL_CAPSULE | ORAL | 0 refills | Status: DC

## 2024-05-18 ENCOUNTER — Telehealth: Payer: Self-pay

## 2024-05-18 DIAGNOSIS — Z789 Other specified health status: Secondary | ICD-10-CM

## 2024-05-18 NOTE — Telephone Encounter (Signed)
 Spoke to patient and advised that I don't have the cost of the Measles titer.  Gave her the number to billing to call and then advised her to call us  back when ready to schedule a lab appointment.  Dm/cma

## 2024-05-18 NOTE — Telephone Encounter (Signed)
 Copied from CRM [id number]. Topic: General - Other >> May 18, 2024  8:42 AM Howard Macho wrote: Reason for CRM: patient called stating she sent a mychart message and has not heard anything back. I let the patient know that a mychart response was sent back to her and I read the message to her. Patient want to know how much a titer cost because she is concerned about her grandchildren because they cannot get the vaccine because they are too young CB [phone]

## 2024-05-18 NOTE — Telephone Encounter (Signed)
Left VM to rtn call. Dm/cma       

## 2024-06-21 NOTE — Telephone Encounter (Signed)
Patient notified VIA phone and lab appt scheduled. Dm/cma  

## 2024-06-21 NOTE — Addendum Note (Signed)
 Addended by: THEDORA GARNETTE HERO on: 06/21/2024 11:10 AM   Modules accepted: Orders

## 2024-06-21 NOTE — Telephone Encounter (Signed)
 Patient called and she is just wanting to go ahead and get the measles titer.  Can you please order this and I will call her and let her know that so we can schedule the lab appointment.  Thanks. Dm/cma

## 2024-06-22 ENCOUNTER — Telehealth: Payer: Self-pay

## 2024-06-22 ENCOUNTER — Telehealth: Payer: Self-pay | Admitting: Family Medicine

## 2024-06-22 NOTE — Telephone Encounter (Signed)
 Patient already has appointment for lab on 06/28/24.  Spoke to her to let her know of date and time again. Dm/cma

## 2024-06-22 NOTE — Telephone Encounter (Signed)
 Copied from CRM [id number]. Topic: Clinical - Request for Lab/Test Order >> Jun 22, 2024  8:40 AM Emily [name] wrote: Reason for CRM: Patient is inquiring about the mmr titer order that she requested be placed. Patient wants to know when will the order be placed so she can schedule her appt, and she is requesting a call back [phone].

## 2024-06-22 NOTE — Telephone Encounter (Signed)
 error

## 2024-06-28 ENCOUNTER — Other Ambulatory Visit (INDEPENDENT_AMBULATORY_CARE_PROVIDER_SITE_OTHER)

## 2024-06-28 DIAGNOSIS — Z789 Other specified health status: Secondary | ICD-10-CM

## 2024-06-29 LAB — MEASLES/MUMPS/RUBELLA IMMUNITY
Mumps IgG: 87.8 [AU]/ml
Rubella: 2.46 {index}
Rubeola IgG: 279 [AU]/ml

## 2024-06-30 ENCOUNTER — Ambulatory Visit: Payer: Self-pay | Admitting: Family Medicine

## 2024-07-04 ENCOUNTER — Other Ambulatory Visit: Payer: Self-pay | Admitting: Obstetrics and Gynecology

## 2024-07-04 DIAGNOSIS — Z780 Asymptomatic menopausal state: Secondary | ICD-10-CM

## 2024-07-05 LAB — HM MAMMOGRAPHY

## 2024-07-21 ENCOUNTER — Ambulatory Visit: Payer: Managed Care, Other (non HMO) | Admitting: Dermatology

## 2024-07-28 ENCOUNTER — Encounter: Payer: Managed Care, Other (non HMO) | Admitting: Family Medicine

## 2024-08-05 ENCOUNTER — Ambulatory Visit: Admitting: Family Medicine

## 2024-08-05 ENCOUNTER — Ambulatory Visit: Payer: Self-pay | Admitting: Family Medicine

## 2024-08-05 ENCOUNTER — Encounter: Payer: Self-pay | Admitting: Family Medicine

## 2024-08-05 VITALS — BP 140/100 | HR 74 | Temp 98.1°F | Ht 67.0 in | Wt 241.6 lb

## 2024-08-05 DIAGNOSIS — E785 Hyperlipidemia, unspecified: Secondary | ICD-10-CM

## 2024-08-05 DIAGNOSIS — M67441 Ganglion, right hand: Secondary | ICD-10-CM

## 2024-08-05 DIAGNOSIS — R030 Elevated blood-pressure reading, without diagnosis of hypertension: Secondary | ICD-10-CM

## 2024-08-05 DIAGNOSIS — E559 Vitamin D deficiency, unspecified: Secondary | ICD-10-CM

## 2024-08-05 DIAGNOSIS — Z1211 Encounter for screening for malignant neoplasm of colon: Secondary | ICD-10-CM

## 2024-08-05 DIAGNOSIS — Z23 Encounter for immunization: Secondary | ICD-10-CM

## 2024-08-05 DIAGNOSIS — Z0000 Encounter for general adult medical examination without abnormal findings: Secondary | ICD-10-CM

## 2024-08-05 LAB — LIPID PANEL
Cholesterol: 254 mg/dL — ABNORMAL HIGH (ref 0–200)
HDL: 60.8 mg/dL (ref >39.00)
LDL Cholesterol: 171 mg/dL — ABNORMAL HIGH (ref 0–99)
NonHDL: 193.43
Total CHOL/HDL Ratio: 4
Triglycerides: 112 mg/dL (ref 0.0–149.0)
VLDL: 22.4 mg/dL (ref 0.0–40.0)

## 2024-08-05 LAB — VITAMIN D 25 HYDROXY (VIT D DEFICIENCY, FRACTURES): VITD: 35.53 ng/mL (ref 30.00–100.00)

## 2024-08-05 MED ORDER — VITAMIN D (ERGOCALCIFEROL) 1.25 MG (50000 UNIT) PO CAPS: 50000.0000 [IU] | ORAL_CAPSULE | ORAL | 3 refills | Status: AC

## 2024-08-05 NOTE — Assessment & Plan Note (Signed)
 Emily [name]'s BP is high today. This may have been triggered by higher salt intake she had at a quilting retreat this weekend. Recommend she monitor her BP at home over the next month. She should send me her log at that point, esp. if more than 50% of her BPs are > 130/80.

## 2024-08-05 NOTE — Assessment & Plan Note (Signed)
 Reassured her that this is a benign finding, likely due to overuse of the hands and arthritis. Recommend expectant management.

## 2024-08-05 NOTE — Assessment & Plan Note (Signed)
 Overall health is good. Recommend regular exercise. Discussed recommended screenings and immunizations.

## 2024-08-05 NOTE — Progress Notes (Signed)
 Emily [name] [name] Emily [name] [place] Emily [name] [name] [place] [address] Dept: [phone] Dept Fax: [phone]  Annual Physical Visit  Subjective:    Patient ID: Emily [name] [name], female    DOB: [birth year], 58 y.o..   MRN: [id number]  Chief Complaint  Patient presents with   Annual Exam    Patient wants to know where she last had colonoscopy done so she could get put on schedule for when she is due.    History of Present Illness:  Patient is in today for an annual physical/preventative visit.  Notes is either using a stationary bike or walking most days for 30 min. Had a right hip joint replacement last winter. Still gets some stiffness if she sits too long, but pain is much improved.  Review of Systems  Constitutional:  Negative for chills, diaphoresis, fever, malaise/fatigue and weight loss.  HENT:  Negative for congestion, ear pain, hearing loss, sinus pain, sore throat and tinnitus.   Eyes:  Negative for blurred vision, pain, discharge and redness.  Respiratory:  Negative for cough, shortness of breath and wheezing.   Cardiovascular:  Negative for chest pain and palpitations.  Gastrointestinal:  Negative for abdominal pain, constipation, diarrhea, heartburn, nausea and vomiting.  Musculoskeletal:  Negative for back pain, joint pain and myalgias.       Notes a lump near the IP joint of the right thumb. This is no specifically tender. Seems to have recently developed.  Skin:  Negative for itching and rash.  Psychiatric/Behavioral:  Negative for depression. The patient is not nervous/anxious.    Past Medical History: Patient Active Problem List   Diagnosis Date Noted   Elevated blood pressure reading without diagnosis of hypertension 08/05/2024   Pain in lower limb 12/17/2023   Acquired cavus deformity of foot 10/16/2023   Presbycusis of both ears 10/30/2022   Borderline hyperlipidemia 06/11/2021   Vitamin D  deficiency 06/11/2021   Overactive  bladder 06/11/2021   Basal cell carcinoma of lip 06/11/2021   Osteoarthritis of sternoclavicular joint, right 06/06/2021   Aortic atherosclerosis (HCC) 06/06/2021   Adenomatous colon polyp 11/03/2019   Vertigo 04/15/2018   Obesity (BMI 30.0-34.9) 09/27/2015   Past Surgical History:  Procedure Laterality Date   bladder botox     BREAST SURGERY  '84   CESAREAN SECTION  '92, '01   x's 2   Family History  Problem Relation Age of Onset   Heart disease Mother    Arthritis Mother    Arthritis Brother    Heart disease Maternal Grandfather    Heart disease Paternal Grandmother    Cancer Paternal Grandfather        Colon   Outpatient Medications Prior to Visit  Medication Sig Dispense Refill   BOTOX 100 units SOLR injection      Vitamin D , Ergocalciferol , (DRISDOL ) 1.25 MG (50000 UNIT) CAPS capsule Take 1 capsule (50,000 Units total) by mouth every 7 (seven) days. 12 capsule 0   celecoxib  (CELEBREX ) 200 MG capsule Take 200 mg by mouth 2 (two) times daily. (Patient not taking: Reported on 08/05/2024)     clotrimazole -betamethasone  (LOTRISONE ) cream Apply 1 Application topically 2 (two) times daily. 60 g 0   diclofenac (VOLTAREN) 75 MG EC tablet Take 1 tablet twice a day by oral route with meal(s) for 30 days. (Patient not taking: Reported on 08/05/2024)     fluconazole  (DIFLUCAN ) 150 MG tablet Take 1 tablet by mouth once. Repeat dose in 3 days if symptoms persist. (Patient not taking:  Reported on 08/05/2024) 2 tablet 0   Multiple Vitamin (MULTIVITAMIN WITH MINERALS) TABS tablet Take 1 tablet by mouth daily. (Patient not taking: Reported on 08/05/2024)     Safety Seal Miscellaneous MISC Wart Pen 60 clicks made with Acyclovir USP 5% Cimetidine USP 2% Imiquimod USP 2.5% Tretinoin USP 0.05% (Patient not taking: Reported on 08/05/2024) 60 Application 0   No facility-administered medications prior to visit.   Allergies  Allergen Reactions   Myrbetriq Evalee Er] Nausea And Vomiting   Other Other  (See Comments)    Red meat- abdominal pain   Objective:   Today's Vitals   08/05/24 0822  BP: (!) 140/100  Pulse: 74  Temp: 98.1 F (36.7 C)  TempSrc: Oral  SpO2: 95%  Weight: 241 lb 9.6 oz (109.6 kg)  Height: 5' 7 (1.702 m)   Body mass index is 37.84 kg/m.   General: Well developed, well nourished. No acute distress. HEENT: Normocephalic, non-traumatic. PERRL, EOMI. Conjunctiva clear. External ears normal. EAC and   TMs normal bilaterally. Nose clear without congestion or rhinorrhea. Mucous membranes moist. Oropharynx   clear. Good dentition. Neck: Supple. No lymphadenopathy. No thyromegaly. Lungs: Clear to auscultation bilaterally. No wheezing, rales or rhonchi. CV: RRR without murmurs or rubs. Pulses 2+ bilaterally. Abdomen: Soft, non-tender. Bowel sounds positive, normal pitch and frequency. No hepatosplenomegaly. No   rebound or guarding. Extremities: Full ROM. The right thumb has a small cystic lesion at the IP joint laterally. This is not red and is nontender. No other joint swelling or tenderness. No edema noted. Skin: Warm and dry. No rashes. Psych: Alert and oriented. Normal mood and affect.  Health Maintenance Due  Topic Date Due   HIV Screening  Never done   Pneumococcal Vaccine: 50+ Years (1 of 1 - PCV) Never done   INFLUENZA VACCINE  07/29/2024   Colonoscopy  10/12/2024     Assessment & Plan:   Problem List Items Addressed This Visit       Other   Annual physical exam - Primary   Overall health is good. Recommend regular exercise. Discussed recommended screenings and immunizations.       Borderline hyperlipidemia   I will reassess lipids today.       Relevant Orders   Lipid panel   Elevated blood pressure reading without diagnosis of hypertension   [name]'s BP is high today. This may have been triggered by higher salt intake she had at a quilting retreat this weekend. Recommend she monitor her BP at home over the next month. She should send  me her log at that point, esp. if more than 50% of her BPs are > 130/80.       Mucous cyst of digit of right hand   Reassured her that this is a benign finding, likely due to overuse of the hands and arthritis. Recommend expectant management.      Vitamin D  deficiency   We will continue to monitor Ms. Shaddix's Vitamin D , as she has been prone to low Vit. D levels. Continue weekly Vitamin D , as she ahs tended to get low on OTC Vitamin D  replacement.      Relevant Medications   Vitamin D , Ergocalciferol , (DRISDOL ) 1.25 MG (50000 UNIT) CAPS capsule   Other Relevant Orders   VITAMIN D  25 Hydroxy (Vit-D Deficiency, Fractures)   Other Visit Diagnoses       Need for Tdap vaccination       Relevant Orders   Tdap vaccine greater than or  equal to 7yo IM (Completed)     Need for pneumococcal 20-valent conjugate vaccination       Relevant Orders   Pneumococcal conjugate vaccine 20-valent (Completed)     Screening for colon cancer       Relevant Orders   Ambulatory referral to Gastroenterology       Return in about 1 year (around 08/05/2025) for Annual preventative care.   Garnette CHRISTELLA Simpler, MD

## 2024-08-05 NOTE — Assessment & Plan Note (Signed)
 I will reassess lipids today.

## 2024-08-05 NOTE — Assessment & Plan Note (Signed)
 We will continue to monitor Emily [name]'s Vitamin D , as she has been prone to low Vit. D levels. Continue weekly Vitamin D , as she ahs tended to get low on OTC Vitamin D  replacement.

## 2024-08-24 ENCOUNTER — Other Ambulatory Visit: Payer: Self-pay | Admitting: Internal Medicine

## 2024-08-24 DIAGNOSIS — E559 Vitamin D deficiency, unspecified: Secondary | ICD-10-CM

## 2024-08-31 ENCOUNTER — Ambulatory Visit (INDEPENDENT_AMBULATORY_CARE_PROVIDER_SITE_OTHER)

## 2024-08-31 DIAGNOSIS — Z780 Asymptomatic menopausal state: Secondary | ICD-10-CM

## 2024-08-31 DIAGNOSIS — Z13820 Encounter for screening for osteoporosis: Secondary | ICD-10-CM | POA: Diagnosis not present

## 2024-11-18 ENCOUNTER — Encounter: Payer: Self-pay | Admitting: Orthopaedic Surgery
